# Patient Record
Sex: Male | Born: 1980 | Race: Black or African American | Hispanic: No | Marital: Single | Smoking: Current every day smoker
Health system: Southern US, Community
[De-identification: ages and names within clinical notes are randomized; demographics above are authoritative.]

## PROBLEM LIST (undated history)

## (undated) DIAGNOSIS — K5792 Diverticulitis of intestine, part unspecified, without perforation or abscess without bleeding: Secondary | ICD-10-CM

## (undated) DIAGNOSIS — I10 Essential (primary) hypertension: Secondary | ICD-10-CM

---

## 2003-10-25 ENCOUNTER — Emergency Department (HOSPITAL_COMMUNITY): Admission: EM | Admit: 2003-10-25 | Discharge: 2003-10-26 | Payer: Self-pay | Admitting: Emergency Medicine

## 2004-02-23 ENCOUNTER — Emergency Department (HOSPITAL_COMMUNITY): Admission: EM | Admit: 2004-02-23 | Discharge: 2004-02-24 | Payer: Self-pay | Admitting: Emergency Medicine

## 2005-06-27 ENCOUNTER — Emergency Department (HOSPITAL_COMMUNITY): Admission: EM | Admit: 2005-06-27 | Discharge: 2005-06-27 | Payer: Self-pay | Admitting: Emergency Medicine

## 2006-08-13 ENCOUNTER — Emergency Department (HOSPITAL_COMMUNITY): Admission: EM | Admit: 2006-08-13 | Discharge: 2006-08-13 | Payer: Self-pay | Admitting: Family Medicine

## 2010-12-14 LAB — POCT H PYLORI SCREEN: H. PYLORI SCREEN, POC: NEGATIVE

## 2014-06-29 ENCOUNTER — Emergency Department (HOSPITAL_COMMUNITY)
Admission: EM | Admit: 2014-06-29 | Discharge: 2014-06-29 | Disposition: A | Discharge status: Home / Self Care | Payer: Self-pay | Attending: Emergency Medicine | Admitting: Emergency Medicine

## 2014-06-29 ENCOUNTER — Encounter (HOSPITAL_COMMUNITY): Payer: Self-pay | Admitting: *Deleted

## 2014-06-29 DIAGNOSIS — Z720 Tobacco use: Secondary | ICD-10-CM | POA: Insufficient documentation

## 2014-06-29 DIAGNOSIS — Z79899 Other long term (current) drug therapy: Secondary | ICD-10-CM | POA: Insufficient documentation

## 2014-06-29 DIAGNOSIS — J020 Streptococcal pharyngitis: Secondary | ICD-10-CM | POA: Insufficient documentation

## 2014-06-29 LAB — RAPID STREP SCREEN (MED CTR MEBANE ONLY): Streptococcus, Group A Screen (Direct): POSITIVE — AB

## 2014-06-29 MED ORDER — PENICILLIN V POTASSIUM 500 MG PO TABS: 500.0000 mg | ORAL_TABLET | Freq: Three times a day (TID) | ORAL | Status: DC

## 2014-06-29 MED ORDER — HYDROCODONE-ACETAMINOPHEN 7.5-325 MG/15ML PO SOLN: 10.0000 mL | Freq: Two times a day (BID) | ORAL | Status: DC

## 2014-06-29 MED ORDER — HYDROCODONE-ACETAMINOPHEN 7.5-325 MG/15ML PO SOLN: 10.0000 mL | Freq: Four times a day (QID) | ORAL | Status: DC | PRN

## 2014-06-29 MED ORDER — DEXAMETHASONE SODIUM PHOSPHATE 10 MG/ML IJ SOLN
10.0000 mg | Freq: Once | INTRAMUSCULAR | Status: AC
  Administered 2014-06-29: 10 mg via INTRAMUSCULAR
  Filled 2014-06-29: qty 1

## 2014-06-29 NOTE — ED Notes (Signed)
Declined W/C at D/C and was escorted to lobby by RN. 

## 2014-06-29 NOTE — ED Notes (Signed)
Pt c/o pain when swallowing and cough.  Throat red, white patches on tonsils.  Pt able to tolerate food.

## 2014-06-29 NOTE — ED Provider Notes (Signed)
CSN: [id number]     Arrival date & time 06/29/14  1213 History  This chart was scribed for non-physician practitioner, Evan Peliffany Octavie Westerhold, PA-C, working with Evan OctaveStephen Rancour, MD, by Ronney LionSuzanne Delgado, ED Scribe. This patient was seen in room TR06C/TR06C and the patient's care was started at 2:18 PM.    Chief Complaint  Patient presents with  . Sore Throat   The history is provided by the patient. No language interpreter was used.  HPI Comments: Evan Delgado is a 34 y.o. male who presents to the Emergency Department complaining of a constant, moderate sore throat that began 2 days ago. He endorses associated fatigue and painful swallowing. Patient denies any known sick contact. He has taken ibuprofen for this, with minimal relief. He denies fever, nausea, vomiting, diarrhea, or cough. Patient has NKDA.   History reviewed. No pertinent past medical history. History reviewed. No pertinent past surgical history. No family history on file. History  Substance Use Topics  . Smoking status: Current Every Day Smoker -- 0.50 packs/day    Types: Cigarettes  . Smokeless tobacco: Not on file  . Alcohol Use: No    Review of Systems  Constitutional: Positive for fever.  HENT: Positive for sore throat.   All other systems reviewed and are negative.   Allergies  Review of patient's allergies indicates no known allergies.  Home Medications   Prior to Admission medications   Medication Sig Start Date End Date Taking? Authorizing Provider  HYDROcodone-acetaminophen (HYCET) 7.5-325 mg/15 ml solution Take 10 mLs by mouth 2 (two) times daily. 06/29/14   Evan Peliffany Sherelle Castelli, PA-C  penicillin v potassium (VEETID) 500 MG tablet Take 1 tablet (500 mg total) by mouth 3 (three) times daily. 06/29/14   Evan Bunyard Neva SeatGreene, PA-C   BP 146/100 mmHg  Pulse 73  Temp(Src) 98.3 F (36.8 C) (Oral)  Resp 18  Ht 5\' 9"  (1.753 m)  Wt 250 lb (113.399 kg)  BMI 36.90 kg/m2  SpO2 99% Physical Exam  Constitutional: He is oriented to  person, place, and time. He appears well-developed and well-nourished. No distress.  HENT:  Head: Normocephalic and atraumatic.  Right Ear: Tympanic membrane, external ear and ear canal normal.  Left Ear: Tympanic membrane, external ear and ear canal normal.  Nose: Nose normal. No rhinorrhea. Right sinus exhibits no maxillary sinus tenderness and no frontal sinus tenderness. Left sinus exhibits no maxillary sinus tenderness and no frontal sinus tenderness.  Mouth/Throat: Uvula is midline and mucous membranes are normal. No trismus in the jaw. Normal dentition. No dental abscesses or uvula swelling. Oropharyngeal exudate and posterior oropharyngeal edema present. No posterior oropharyngeal erythema or tonsillar abscesses.  No submental edema, tongue not elevated, no trismus. No impending airway obstruction; Pt able to speak full sentences, swallow intact, no drooling, stridor, or tonsillar/uvula displacement. No palatal petechia  Eyes: Conjunctivae and EOM are normal.  Neck: Trachea normal, normal range of motion and full passive range of motion without pain. Neck supple. No rigidity. No tracheal deviation and normal range of motion present. No Brudzinski's sign noted.  Flexion and extension of neck without pain or difficulty. Able to breath without difficulty in extension.  Cardiovascular: Normal rate and regular rhythm.   Pulmonary/Chest: Effort normal and breath sounds normal. No stridor. No respiratory distress. He has no wheezes.  Abdominal: Soft. There is no tenderness.  No obvious evidence of splenomegaly. Non ttp.   Musculoskeletal: Normal range of motion.  Lymphadenopathy:       Head (right side): No preauricular and  no posterior auricular adenopathy present.       Head (left side): No preauricular and no posterior auricular adenopathy present.    He has cervical adenopathy.  Neurological: He is alert and oriented to person, place, and time.  Skin: Skin is warm and dry. No rash noted. He  is not diaphoretic.  Psychiatric: He has a normal mood and affect. His behavior is normal.  Nursing note and vitals reviewed.   ED Course  Procedures (including critical care time)  DIAGNOSTIC STUDIES: Oxygen Saturation is 99% on RA, normal by my interpretation.    COORDINATION OF CARE: 2:20 PM - Discussed strep test results with patient. Also discussed treatment plan with pt at bedside which includes antibiotic medications, and rest from work for 2 days, and pt agreed to plan.    Labs Review Labs Reviewed  RAPID STREP SCREEN - Abnormal; Notable for the following:    Streptococcus, Group A Screen (Direct) POSITIVE (*)    All other components within normal limits     MDM   Final diagnoses:  Strep pharyngitis   Meds ordered this encounter  Medications  . dexamethasone (DECADRON) injection 10 mg    Sig:   . DISCONTD: penicillin v potassium (VEETID) 500 MG tablet    Sig: Take 1 tablet (500 mg total) by mouth 3 (three) times daily.    Dispense:  30 tablet    Refill:  0    Order Specific Question:  Supervising Provider    Answer:  MILLER, BRIAN [[id number]]  . DISCONTD: HYDROcodone-acetaminophen (HYCET) 7.5-325 mg/15 ml solution    Sig: Take 10 mLs by mouth 4 (four) times daily as needed for moderate pain.    Dispense:  120 mL    Refill:  0    Order Specific Question:  Supervising Provider    Answer:  MILLER, BRIAN [[id number]]  . HYDROcodone-acetaminophen (HYCET) 7.5-325 mg/15 ml solution    Sig: Take 10 mLs by mouth 2 (two) times daily.    Dispense:  60 mL    Refill:  0    Order Specific Question:  Supervising Provider    Answer:  MILLER, BRIAN [[id number]]  . penicillin v potassium (VEETID) 500 MG tablet    Sig: Take 1 tablet (500 mg total) by mouth 3 (three) times daily.    Dispense:  30 tablet    Refill:  0    Order Specific Question:  Supervising Provider    Answer:  Evan Delgado, BRIAN [[id number]]    Medications  dexamethasone (DECADRON) injection 10 mg (not administered)     Filed  Vitals:   06/29/14 1243  BP: 146/100  Pulse: 73  Temp: 98.3 F (36.8 C)  TempSrc: Oral  Resp: 18  Height:  (1.753 m)  Weight: 250 lb (113.399 kg)  SpO2: 99%    33 y.o.Hakiem Urbieta's evaluation in the Emergency Department is complete. It has been determined that no acute conditions requiring further emergency intervention are present at this time. The patient/guardian have been advised of the diagnosis and plan. We have discussed signs and symptoms that warrant return to the ED, such as changes or worsening in symptoms.  Vital signs are stable at discharge. Filed Vitals:   06/29/14 1243  BP: 146/100  Pulse: 73  Temp: 98.3 F (36.8 C)  Resp: 18    Patient/guardian has voiced understanding and agreed to follow-up with the PCP or specialist.  I personally performed the services described in this documentation, which was scribed in my presence. The  recorded information has been reviewed and is accurate.    Evan Pel, PA-C 06/29/14 1425  Evan Octave, MD 06/29/14 1600

## 2014-06-29 NOTE — Discharge Instructions (Signed)

## 2016-09-01 ENCOUNTER — Emergency Department (HOSPITAL_COMMUNITY)
Admission: EM | Admit: 2016-09-01 | Discharge: 2016-09-02 | Disposition: A | Discharge status: Home / Self Care | Payer: Self-pay | Attending: Emergency Medicine | Admitting: Emergency Medicine

## 2016-09-01 ENCOUNTER — Other Ambulatory Visit: Payer: Self-pay

## 2016-09-01 ENCOUNTER — Encounter (HOSPITAL_COMMUNITY): Payer: Self-pay | Admitting: Emergency Medicine

## 2016-09-01 DIAGNOSIS — R197 Diarrhea, unspecified: Secondary | ICD-10-CM | POA: Insufficient documentation

## 2016-09-01 DIAGNOSIS — E860 Dehydration: Secondary | ICD-10-CM | POA: Insufficient documentation

## 2016-09-01 DIAGNOSIS — R112 Nausea with vomiting, unspecified: Secondary | ICD-10-CM | POA: Insufficient documentation

## 2016-09-01 DIAGNOSIS — F17210 Nicotine dependence, cigarettes, uncomplicated: Secondary | ICD-10-CM | POA: Insufficient documentation

## 2016-09-01 HISTORY — DX: Diverticulitis of intestine, part unspecified, without perforation or abscess without bleeding: K57.92

## 2016-09-01 LAB — I-STAT CG4 LACTIC ACID, ED: Lactic Acid, Venous: 2.57 mmol/L (ref 0.5–1.9)

## 2016-09-01 LAB — CBG MONITORING, ED: Glucose-Capillary: 180 mg/dL — ABNORMAL HIGH (ref 65–99)

## 2016-09-01 MED ORDER — SODIUM CHLORIDE 0.9 % IV BOLUS (SEPSIS)
1000.0000 mL | Freq: Once | INTRAVENOUS | Status: AC
  Administered 2016-09-02: 1000 mL via INTRAVENOUS

## 2016-09-01 MED ORDER — SODIUM CHLORIDE 0.9 % IV BOLUS (SEPSIS)
1000.0000 mL | Freq: Once | INTRAVENOUS | Status: AC
  Administered 2016-09-01: 1000 mL via INTRAVENOUS

## 2016-09-01 NOTE — ED Notes (Signed)
The pt is c/o abd pain n v and diarrhea for one week.  He reports that he feels,like he is going to pass out.  Alert but keeps his eyes closed  askikng the male with him to talk for him.  No acute distress vital sign  gooid

## 2016-09-01 NOTE — ED Triage Notes (Signed)
Pt present diaphoretic and presyncopal from home, pt reports abd pain with nausea onset 2 hours ago. Pt appears pale and speaking in short sentences

## 2016-09-01 NOTE — ED Notes (Signed)
Dr Bebe ShaggyWickline given a copy of lactic acid results 2.57

## 2016-09-01 NOTE — ED Notes (Signed)
Head lowered

## 2016-09-01 NOTE — ED Notes (Signed)
No stool on the temp probe

## 2016-09-01 NOTE — ED Notes (Signed)
The pt is hyperventilating and he reports that he is going to pass out.  Cautioned to slow his respirations.. He has not eaten anything today but a banana

## 2016-09-01 NOTE — ED Provider Notes (Signed)
[facility]-EMERGENCY DEPT Provider Note   CSN: [id number] Arrival date & time: 09/01/16  2221 By signing my name below, I, Levon Hedger, attest that this documentation has been prepared under the direction and in the presence of Zadie Rhine, MD . Electronically Signed: Levon Hedger, Scribe. 09/01/2016. 11:25 PM.   History   Chief Complaint Chief Complaint  Patient presents with  . Abdominal Pain   HPI Evan Delgado is a 36 y.o. male with a history of diverticulitis who presents to the Emergency Department complaining of intermttent abdominal pain onset four days ago which has since resolved. He describes this as sharp, shooting pain that is relieved by having bowel movements. Per pt, he is not currently in any pain. He reports associated diaphoresis, nausea, vomiting, non bloody diarrhea, shortness of breath and lightheadedness. Per pt, he has had 2-3 episodes of diarrhea since symptoms began. Pt states he feels as if he is going to pass out, but denies any syncopal episodes. He has never experienced this before. No recent sick contacts; no recent foreign travel. No abdominal SHx. He denies any CP, hematemesis, or hematochezia. Pt has no other acute complaints or associated symptoms at this time.     The history is provided by the patient. No language interpreter was used.  Abdominal Pain   This is a new problem. The current episode started more than 2 days ago. The problem has been resolved. The pain is associated with an unknown factor. The quality of the pain is sharp and shooting. Associated symptoms include diarrhea, nausea and vomiting. Nothing aggravates the symptoms. The symptoms are relieved by bowel activity. His past medical history does not include PUD, gallstones, GERD, ulcerative colitis, Crohn's disease or irritable bowel syndrome.    Past Medical History:  Diagnosis Date  . Diverticulitis     There are no active problems to display for this patient.   History reviewed.  No pertinent surgical history.   Home Medications    Prior to Admission medications   Medication Sig Start Date End Date Taking? Authorizing Provider  HYDROcodone-acetaminophen (HYCET) 7.5-325 mg/15 ml solution Take 10 mLs by mouth 2 (two) times daily. 06/29/14   Marlon Pel, PA-C  penicillin v potassium (VEETID) 500 MG tablet Take 1 tablet (500 mg total) by mouth 3 (three) times daily. 06/29/14   Marlon Pel, PA-C    Family History No family history on file.  Social History Social History  Substance Use Topics  . Smoking status: Current Every Day Smoker    Packs/day: 0.50    Types: Cigarettes  . Smokeless tobacco: Never Used  . Alcohol use No     Allergies   Patient has no known allergies.  Review of Systems Review of Systems  Constitutional: Positive for diaphoresis.  Respiratory: Positive for shortness of breath.   Cardiovascular: Negative for chest pain.  Gastrointestinal: Positive for abdominal pain, diarrhea, nausea and vomiting. Negative for anal bleeding and blood in stool.  Neurological: Negative for syncope.  All other systems reviewed and are negative.   Physical Exam Updated Vital Signs BP (!) 130/94   Pulse (!) 103   Temp (!) 97.1 F (36.2 C) (Axillary)   Resp (!) 26   Ht 5\' 9"  (1.753 m)   Wt 250 lb (113.4 kg)   SpO2 100%   BMI 36.92 kg/m   Physical Exam  Nursing note and vitals reviewed. CONSTITUTIONAL: Ill-appearing and diaphoretic HEAD: Normocephalic/atraumatic EYES: EOMI/PERRL. No icterus  ENMT: Mucous membranes moist NECK: supple no meningeal signs  SPINE/BACK:entire spine nontender CV: S1/S2 noted, no murmurs/rubs/gallops noted LUNGS: Lungs are clear to auscultation bilaterally, no apparent distress ABDOMEN: soft, nontender, no rebound or guarding, bowel sounds noted throughout abdomen GU:no cva tenderness NEURO: Pt is awake/alert/appropriate, moves all extremitiesx4.  No facial droop.   EXTREMITIES: pulses normal/equal, full  ROM SKIN: diaphoretic PSYCH: anxious  ED Treatments / Results  DIAGNOSTIC STUDIES:  Oxygen Saturation is 100% on RA, normal by my interpretation.    COORDINATION OF CARE:  11:11 PM Discussed treatment plan with pt at bedside and pt agreed to plan.   Labs (all labs ordered are listed, but only abnormal results are displayed) Labs Reviewed  COMPREHENSIVE METABOLIC PANEL - Abnormal; Notable for the following:       Result Value   Chloride 99 (*)    CO2 17 (*)    Glucose, Bld 177 (*)    BUN 22 (*)    Creatinine, Ser 1.71 (*)    Total Protein 9.0 (*)    Total Bilirubin 1.5 (*)    GFR calc non Af Amer 50 (*)    GFR calc Af Amer 58 (*)    Anion gap 20 (*)    All other components within normal limits  CBC WITH DIFFERENTIAL/PLATELET - Abnormal; Notable for the following:    WBC 14.6 (*)    RBC 6.10 (*)    Neutro Abs 11.6 (*)    All other components within normal limits  TROPONIN I - Abnormal; Notable for the following:    Troponin I 0.04 (*)    All other components within normal limits  BASIC METABOLIC PANEL - Abnormal; Notable for the following:    CO2 18 (*)    Glucose, Bld 140 (*)    BUN 25 (*)    Creatinine, Ser 1.51 (*)    GFR calc non Af Amer 58 (*)    All other components within normal limits  CBG MONITORING, ED - Abnormal; Notable for the following:    Glucose-Capillary 180 (*)    All other components within normal limits  I-STAT CG4 LACTIC ACID, ED - Abnormal; Notable for the following:    Lactic Acid, Venous 2.57 (*)    All other components within normal limits  LIPASE, BLOOD  I-STAT CG4 LACTIC ACID, ED    EKG  EKG Interpretation  Date/Time:  Friday September 01 2016 22:23:57 EDT Ventricular Rate:  103 PR Interval:  116 QRS Duration: 84 QT Interval:  342 QTC Calculation: 448 R Axis:   47 Text Interpretation:  Sinus tachycardia Otherwise normal ECG Confirmed by Zadie Rhine ([id number]) on 09/01/2016 11:02:59 PM       Radiology No results  found.  Procedures Procedures   Medications Ordered in ED Medications  sodium chloride 0.9 % bolus 1,000 mL (0 mLs Intravenous Stopped 09/02/16 0335)  sodium chloride 0.9 % bolus 1,000 mL (0 mLs Intravenous Stopped 09/02/16 0226)  sodium chloride 0.9 % bolus 1,000 mL (1,000 mLs Intravenous New Bag/Given 09/02/16 0335)  ondansetron (ZOFRAN) injection 4 mg (4 mg Intravenous Given 09/02/16 0115)     Initial Impression / Assessment and Plan / ED Course  I have reviewed the triage vital signs and the nursing notes.  Pertinent labs & imaging results that were available during my care of the patient were reviewed by me and considered in my medical decision making (see chart for details).     11:44 PM Pt very poor historian, but it appears he has had abd pain/diarrhea recently, however pain  now gone He is diaphoretic and hyperventilating Labs ordered 12:15 AM Pt improved Denies ABD pain 12:22 AM Pt with significant dehydration, but improving Will need IV fluids and lab recheck He denies pain No CP reported, elevated troponin likely due to dehydration 1:20 AM Pt vomited large amount, nonbloody However, he now reports feeling improved He does not want to be admitted Plan to rehydrate and recheck labs 4:47 AM Pt improved He is ambulatory Labs improved, lactate cleared and metabolic acidosis improving He is taking PO No focal ABD tenderness He is now urinating He wants to go home  He now reports he thinks it was "bad food" from earlier in the week that triggered diffuse abd cramping and diarrhea His significant other had vomiting after same meal He did have recent antibiotics, but suspicion for cdif is low  Will d/c home Encouraged PO fluids We discussed strict return precautions  Final Clinical Impressions(s) / ED Diagnoses   Final diagnoses:  Nausea vomiting and diarrhea  Dehydration    New Prescriptions New Prescriptions   No medications on file   I personally  performed the services described in this documentation, which was scribed in my presence. The recorded information has been reviewed and is accurate.        Zadie RhineWickline, Gerrald Basu, MD 09/02/16 [phone]

## 2016-09-02 LAB — I-STAT CG4 LACTIC ACID, ED: Lactic Acid, Venous: 1.86 mmol/L (ref 0.5–1.9)

## 2016-09-02 LAB — CBC WITH DIFFERENTIAL/PLATELET
Basophils Absolute: 0 10*3/uL (ref 0.0–0.1)
Basophils Relative: 0 %
Eosinophils Absolute: 0.1 10*3/uL (ref 0.0–0.7)
Eosinophils Relative: 1 %
HEMATOCRIT: 49.1 % (ref 39.0–52.0)
HEMOGLOBIN: 16.9 g/dL (ref 13.0–17.0)
LYMPHS ABS: 2 10*3/uL (ref 0.7–4.0)
Lymphocytes Relative: 14 %
MCH: 27.7 pg (ref 26.0–34.0)
MCHC: 34.4 g/dL (ref 30.0–36.0)
MCV: 80.5 fL (ref 78.0–100.0)
MONO ABS: 0.9 10*3/uL (ref 0.1–1.0)
MONOS PCT: 6 %
NEUTROS PCT: 79 %
Neutro Abs: 11.6 10*3/uL — ABNORMAL HIGH (ref 1.7–7.7)
Platelets: 390 10*3/uL (ref 150–400)
RBC: 6.1 MIL/uL — AB (ref 4.22–5.81)
RDW: 13.8 % (ref 11.5–15.5)
WBC: 14.6 10*3/uL — AB (ref 4.0–10.5)

## 2016-09-02 LAB — BASIC METABOLIC PANEL
Anion gap: 15 (ref 5–15)
BUN: 25 mg/dL — AB (ref 6–20)
CALCIUM: 9.2 mg/dL (ref 8.9–10.3)
CO2: 18 mmol/L — ABNORMAL LOW (ref 22–32)
CREATININE: 1.51 mg/dL — AB (ref 0.61–1.24)
Chloride: 103 mmol/L (ref 101–111)
GFR calc Af Amer: >60 mL/min (ref >60)
GFR calc non Af Amer: 58 mL/min — ABNORMAL LOW (ref >60)
GLUCOSE: 140 mg/dL — AB (ref 65–99)
Potassium: 3.9 mmol/L (ref 3.5–5.1)
Sodium: 136 mmol/L (ref 135–145)

## 2016-09-02 LAB — COMPREHENSIVE METABOLIC PANEL
ALBUMIN: 4.2 g/dL (ref 3.5–5.0)
ALT: 20 U/L (ref 17–63)
ANION GAP: 20 — AB (ref 5–15)
AST: 20 U/L (ref 15–41)
Alkaline Phosphatase: 71 U/L (ref 38–126)
BUN: 22 mg/dL — ABNORMAL HIGH (ref 6–20)
CALCIUM: 10.3 mg/dL (ref 8.9–10.3)
CHLORIDE: 99 mmol/L — AB (ref 101–111)
CO2: 17 mmol/L — AB (ref 22–32)
Creatinine, Ser: 1.71 mg/dL — ABNORMAL HIGH (ref 0.61–1.24)
GFR calc non Af Amer: 50 mL/min — ABNORMAL LOW (ref >60)
GFR, EST AFRICAN AMERICAN: 58 mL/min — AB (ref >60)
GLUCOSE: 177 mg/dL — AB (ref 65–99)
Potassium: 4.4 mmol/L (ref 3.5–5.1)
Sodium: 136 mmol/L (ref 135–145)
TOTAL PROTEIN: 9 g/dL — AB (ref 6.5–8.1)
Total Bilirubin: 1.5 mg/dL — ABNORMAL HIGH (ref 0.3–1.2)

## 2016-09-02 LAB — TROPONIN I: TROPONIN I: 0.04 ng/mL — AB (ref <0.03)

## 2016-09-02 LAB — LIPASE, BLOOD: Lipase: 27 U/L (ref 11–51)

## 2016-09-02 MED ORDER — SODIUM CHLORIDE 0.9 % IV BOLUS (SEPSIS)
1000.0000 mL | Freq: Once | INTRAVENOUS | Status: AC
  Administered 2016-09-02: 1000 mL via INTRAVENOUS

## 2016-09-02 MED ORDER — ONDANSETRON HCL 4 MG/2ML IJ SOLN
4.0000 mg | Freq: Once | INTRAMUSCULAR | Status: AC
  Administered 2016-09-02: 4 mg via INTRAVENOUS
  Filled 2016-09-02: qty 2

## 2016-09-02 NOTE — Discharge Instructions (Signed)

## 2016-09-02 NOTE — ED Notes (Signed)
The pt continues to hyperventilate   Advised to slow down his breathing.  Doing orthostatics when he was asked to sit up and dangle  He threw the vomit bag  In the floor deliberately   The male with him picked it up  He reports that he cannot stand for the upright bp

## 2016-09-02 NOTE — ED Notes (Signed)
Pt asked for water  Given per doctors order.  withing 15 minutes the pt vomited approx 1000cc nss  liquid

## 2016-09-29 ENCOUNTER — Encounter (HOSPITAL_COMMUNITY): Payer: Self-pay

## 2016-09-29 ENCOUNTER — Emergency Department (HOSPITAL_COMMUNITY)
Admission: EM | Admit: 2016-09-29 | Discharge: 2016-09-29 | Disposition: A | Discharge status: Home / Self Care | Payer: Self-pay | Attending: Emergency Medicine | Admitting: Emergency Medicine

## 2016-09-29 DIAGNOSIS — R350 Frequency of micturition: Secondary | ICD-10-CM | POA: Insufficient documentation

## 2016-09-29 DIAGNOSIS — R5383 Other fatigue: Secondary | ICD-10-CM | POA: Insufficient documentation

## 2016-09-29 DIAGNOSIS — R3129 Other microscopic hematuria: Secondary | ICD-10-CM | POA: Insufficient documentation

## 2016-09-29 DIAGNOSIS — F17210 Nicotine dependence, cigarettes, uncomplicated: Secondary | ICD-10-CM | POA: Insufficient documentation

## 2016-09-29 LAB — URINALYSIS, ROUTINE W REFLEX MICROSCOPIC
BACTERIA UA: NONE SEEN
BILIRUBIN URINE: NEGATIVE
GLUCOSE, UA: NEGATIVE mg/dL
Ketones, ur: 20 mg/dL — AB
NITRITE: NEGATIVE
Protein, ur: NEGATIVE mg/dL
Specific Gravity, Urine: 1.016 (ref 1.005–1.030)
pH: 5 (ref 5.0–8.0)

## 2016-09-29 LAB — CBC WITH DIFFERENTIAL/PLATELET
BASOS ABS: 0 10*3/uL (ref 0.0–0.1)
Basophils Relative: 0 %
EOS ABS: 0.1 10*3/uL (ref 0.0–0.7)
EOS PCT: 1 %
HCT: 40.1 % (ref 39.0–52.0)
Hemoglobin: 13.8 g/dL (ref 13.0–17.0)
LYMPHS PCT: 32 %
Lymphs Abs: 3.5 10*3/uL (ref 0.7–4.0)
MCH: 27.4 pg (ref 26.0–34.0)
MCHC: 34.4 g/dL (ref 30.0–36.0)
MCV: 79.7 fL (ref 78.0–100.0)
MONO ABS: 0.8 10*3/uL (ref 0.1–1.0)
Monocytes Relative: 7 %
Neutro Abs: 6.6 10*3/uL (ref 1.7–7.7)
Neutrophils Relative %: 60 %
PLATELETS: 340 10*3/uL (ref 150–400)
RBC: 5.03 MIL/uL (ref 4.22–5.81)
RDW: 14.4 % (ref 11.5–15.5)
WBC: 11 10*3/uL — ABNORMAL HIGH (ref 4.0–10.5)

## 2016-09-29 LAB — BASIC METABOLIC PANEL
ANION GAP: 10 (ref 5–15)
BUN: 9 mg/dL (ref 6–20)
CALCIUM: 9.6 mg/dL (ref 8.9–10.3)
CO2: 24 mmol/L (ref 22–32)
Chloride: 103 mmol/L (ref 101–111)
Creatinine, Ser: 1.1 mg/dL (ref 0.61–1.24)
GFR calc Af Amer: >60 mL/min (ref >60)
Glucose, Bld: 102 mg/dL — ABNORMAL HIGH (ref 65–99)
POTASSIUM: 4.3 mmol/L (ref 3.5–5.1)
SODIUM: 137 mmol/L (ref 135–145)

## 2016-09-29 LAB — CBG MONITORING, ED: Glucose-Capillary: 125 mg/dL — ABNORMAL HIGH (ref 65–99)

## 2016-09-29 NOTE — ED Triage Notes (Signed)
Patient c/o feeling fatigue and having urinary frequency. Patient's BP elevated in triage.

## 2016-09-29 NOTE — ED Provider Notes (Signed)
[facility]-EMERGENCY DEPT Provider Note   CSN: [id number] Arrival date & time: 09/29/16  1729     History   Chief Complaint Chief Complaint  Patient presents with  . Urinary Frequency  . Fatigue  . Hypertension    HPI Evan Delgado is a 36 y.o. male.   Urinary Frequency  This is a new problem. Episode onset: 3 days. The problem occurs daily. The problem has not changed since onset.Pertinent negatives include no chest pain, no abdominal pain, no headaches and no shortness of breath. Nothing aggravates the symptoms. Nothing relieves the symptoms. He has tried nothing for the symptoms.  Hypertension  Pertinent negatives include no chest pain, no abdominal pain, no headaches and no shortness of breath.    Past Medical History:  Diagnosis Date  . Diverticulitis     There are no active problems to display for this patient.   History reviewed. No pertinent surgical history.     Home Medications    Prior to Admission medications   Not on File    Family History No family history on file.  Social History Social History  Substance Use Topics  . Smoking status: Current Every Day Smoker    Packs/day: 0.50    Types: Cigarettes  . Smokeless tobacco: Never Used  . Alcohol use No     Allergies   Patient has no known allergies.   Review of Systems Review of Systems  Constitutional: Positive for fatigue. Negative for chills and fever.  HENT: Negative for ear pain and sore throat.   Eyes: Negative for pain and visual disturbance.  Respiratory: Negative for cough and shortness of breath.   Cardiovascular: Negative for chest pain and palpitations.  Gastrointestinal: Negative for abdominal pain and vomiting.  Genitourinary: Positive for frequency. Negative for decreased urine volume, difficulty urinating, dysuria, flank pain, hematuria and testicular pain.  Musculoskeletal: Negative for arthralgias and back pain.  Skin: Negative for color change and rash.  Neurological:  Negative for seizures, syncope and headaches.  All other systems reviewed and are negative.     Physical Exam Updated Vital Signs BP (!) 159/98 (BP Location: Left Arm)   Pulse 72   Temp 97.9 F (36.6 C) (Oral)   Resp 18   Ht 5\' 9"  (1.753 m)   Wt 113.4 kg (250 lb)   SpO2 100%   BMI 36.92 kg/m   Physical Exam  Constitutional: He is oriented to person, place, and time. He appears well-developed and well-nourished. No distress.  HENT:  Head: Normocephalic and atraumatic.  Nose: Nose normal.  Eyes: Pupils are equal, round, and reactive to light. Conjunctivae and EOM are normal. Right eye exhibits no discharge. Left eye exhibits no discharge. No scleral icterus.  Neck: Normal range of motion. Neck supple.  Cardiovascular: Normal rate and regular rhythm.  Exam reveals no gallop and no friction rub.   No murmur heard. Pulmonary/Chest: Effort normal and breath sounds normal. No stridor. No respiratory distress. He has no rales.  Abdominal: Soft. He exhibits no distension. There is no tenderness.  Musculoskeletal: He exhibits no edema or tenderness.  Neurological: He is alert and oriented to person, place, and time.  Skin: Skin is warm and dry. No rash noted. He is not diaphoretic. No erythema.  Psychiatric: He has a normal mood and affect.  Vitals reviewed.    ED Treatments / Results  Labs (all labs ordered are listed, but only abnormal results are displayed) Labs Reviewed  URINALYSIS, ROUTINE W REFLEX MICROSCOPIC - Abnormal;  Notable for the following:       Result Value   Hgb urine dipstick MODERATE (*)    Ketones, ur 20 (*)    Leukocytes, UA SMALL (*)    Squamous Epithelial / LPF 0-5 (*)    All other components within normal limits  CBC WITH DIFFERENTIAL/PLATELET - Abnormal; Notable for the following:    WBC 11.0 (*)    All other components within normal limits  BASIC METABOLIC PANEL - Abnormal; Notable for the following:    Glucose, Bld 102 (*)    All other components  within normal limits  CBG MONITORING, ED - Abnormal; Notable for the following:    Glucose-Capillary 125 (*)    All other components within normal limits    EKG  EKG Interpretation None       Radiology No results found.  Procedures Procedures (including critical care time)  Medications Ordered in ED Medications - No data to display   Initial Impression / Assessment and Plan / ED Course  I have reviewed the triage vital signs and the nursing notes.  Pertinent labs & imaging results that were available during my care of the patient were reviewed by me and considered in my medical decision making (see chart for details).    1. Urinary frequency UA with hematuria. Not concerning for urinary tract infection. No flank pain or abdominal pain that would be consistent with renal colic from nephrolithiasis. No evidence of diabetes. No renal insufficiency. No electrolyte derangements. No anemia.   2. Hypertension Asymptomatic. Improving without intervention. On review of records patient has not had hypertension in the past. Unsure of etiology at this time. No evidence of end organ damage. We will not initiate antihypertensive medication. I did recommend patient establish care with a primary care provider for reevaluation of the hypertension and his hematuria.  Patient was provided with resources for financial assistance.  The patient is safe for discharge with strict return precautions.  Final Clinical Impressions(s) / ED Diagnoses   Final diagnoses:  Other microscopic hematuria  Urinary frequency  Fatigue, unspecified type   Disposition: Discharge  Condition: Good  I have discussed the results, Dx and Tx plan with the patient who expressed understanding and agree(s) with the plan. Discharge instructions discussed at great length. The patient was given strict return precautions who verbalized understanding of the instructions. No further questions at time of discharge.     New Prescriptions   No medications on file    Follow Up: Primary care provider         Nira Connardama, Pedro Eduardo, MD 09/29/16 2321

## 2016-10-01 ENCOUNTER — Emergency Department (HOSPITAL_COMMUNITY)
Admission: EM | Admit: 2016-10-01 | Discharge: 2016-10-01 | Disposition: A | Discharge status: Home / Self Care | Payer: Self-pay | Attending: Emergency Medicine | Admitting: Emergency Medicine

## 2016-10-01 ENCOUNTER — Encounter (HOSPITAL_COMMUNITY): Payer: Self-pay | Admitting: Nurse Practitioner

## 2016-10-01 DIAGNOSIS — F17210 Nicotine dependence, cigarettes, uncomplicated: Secondary | ICD-10-CM | POA: Insufficient documentation

## 2016-10-01 DIAGNOSIS — I10 Essential (primary) hypertension: Secondary | ICD-10-CM | POA: Insufficient documentation

## 2016-10-01 MED ORDER — ONDANSETRON 4 MG PO TBDP
4.0000 mg | ORAL_TABLET | Freq: Once | ORAL | Status: AC
  Administered 2016-10-01: 4 mg via ORAL
  Filled 2016-10-01: qty 1

## 2016-10-01 NOTE — Discharge Instructions (Signed)
Please follow up with a primary care provider for further evaluation of your health and have your blood pressure recheck.

## 2016-10-01 NOTE — ED Notes (Signed)
Patient verbalized understanding of discharge instructions and denies any further needs or questions at this time. VS stable. Patient ambulatory with steady gait. RN escorted to ED entrance in wheelchair.   

## 2016-10-01 NOTE — ED Provider Notes (Signed)
[facility]-EMERGENCY DEPT Provider Note   CSN: [id number] Arrival date & time: 10/01/16  1539     History   Chief Complaint Chief Complaint  Patient presents with  . Hypertension    HPI Evan Delgado is a 36 y.o. male.  HPI   36 year old male without hx of HTN presenting for evaluation of high blood pressure. Patient was seen 2 days ago in the ER with complaints of urinary frequency and elevated blood pressure. He did have blood in his urine but no evidence of a tract infection or signs of kidney stone. His electrolytes panel are reassuring.No hyperglycemia He was mildly hypertensive during that visit however he is asymptomatic and no specific treatment was given. He was recommended to follow-up with a Primary Care Provider for further care. [facility] and it was 140/90 and was concern, decided to come back to the ER for further evaluation. He denies any significant fever, chills, headache, dizziness, chest pain, shortness of breath, abdominal pain, back pain, focal numbness or weakness. Does smoke 2 cigarettes a day. Denies any strong family history of premature cardiac disease. No history of diabetes. He has not scheduled an appointment for PCP yet. He has been eating and drinking fine.  Past Medical History:  Diagnosis Date  . Diverticulitis     There are no active problems to display for this patient.   History reviewed. No pertinent surgical history.     Home Medications    Prior to Admission medications   Not on File    Family History History reviewed. No pertinent family history.  Social History Social History  Substance Use Topics  . Smoking status: Current Every Day Smoker    Packs/day: 0.50    Types: Cigarettes  . Smokeless tobacco: Never Used  . Alcohol use No     Allergies   Patient has no known allergies.   Review of Systems Review of Systems  All other systems reviewed and are negative.    Physical  Exam Updated Vital Signs BP (!) 154/102   Pulse 96   Temp 98 F (36.7 C) (Oral)   Resp 16   SpO2 100%   Physical Exam  Constitutional: He is oriented to person, place, and time. He appears well-developed and well-nourished. No distress.  HENT:  Head: Atraumatic.  Eyes: Conjunctivae are normal.  Neck: Neck supple.  Cardiovascular: Normal rate and regular rhythm.   Pulmonary/Chest: Effort normal and breath sounds normal.  Abdominal: Soft. Bowel sounds are normal. He exhibits no distension. There is no tenderness.  Neurological: He is alert and oriented to person, place, and time.  Neurologic exam:  Speech clear, pupils equal round reactive to light, extraocular movements intact  Normal peripheral visual fields Cranial nerves III through XII normal including no facial droop Follows commands, moves all extremities x4, normal strength to bilateral upper and lower extremities at all major muscle groups including grip Sensation normal to light touch and pinprick Coordination intact, no limb ataxia, finger-nose-finger normal Rapid alternating movements normal No pronator drift Gait normal   Skin: No rash noted.  Psychiatric: He has a normal mood and affect.  Nursing note and vitals reviewed.    ED Treatments / Results  Labs (all labs ordered are listed, but only abnormal results are displayed) Labs Reviewed - No data to display  EKG  EKG Interpretation None       Radiology No results found.  Procedures Procedures (including critical care time)  Medications Ordered  in ED Medications - No data to display   Initial Impression / Assessment and Plan / ED Course  I have reviewed the triage vital signs and the nursing notes.  Pertinent labs & imaging results that were available during my care of the patient were reviewed by me and considered in my medical decision making (see chart for details).     BP (!) 153/99   Pulse 75   Temp 98 F (36.7 C) (Oral)   Resp 16    SpO2 100%    Final Clinical Impressions(s) / ED Diagnoses   Final diagnoses:  Asymptomatic hypertension    New Prescriptions New Prescriptions   No medications on file   4:59 PM Patient here complaining of feeling lightheadedness and not feeling well which she attributed to his high blood pressure that was incidentally found 2 days ago when he was complaining of some symptoms. His blood pressure is 154/102. He has no focal neuro deficit on exam. He has no chest pain or any sign to suggest hypertensive emergency. He is well-appearing. I do not think patient would benefit from blood pressure medication at this time. He will need to be seen by PCP for definitive diagnosis of hypertension. He does not have orthostatic vital sign. Encouraged patient to quit smoking. Return precaution discussed. Patient is stable for discharge. He does need to follow-up with a urologist for further evaluation of his hematuria.   Fayrene Helperran, Ellyce Lafevers, PA-C 10/01/16 1700    Gerhard MunchLockwood, Robert, MD 10/01/16 [phone]

## 2016-10-01 NOTE — ED Triage Notes (Signed)
Pt presents with c/o elevated blood pressure.  He was seen at [facility] two days ago and told that his blood pressure was high and he should schedule follow up with PCP for further management.  He checked his blood pressure yesterday and today at home with elevated readings of 140's/90s. He reports feeling lightheaded over the past several days and is concerned that his blood pressure is too high. He denies past history of hypertension prior to this week. He has not scheduled a primary care appointment yet.

## 2016-11-02 DIAGNOSIS — I10 Essential (primary) hypertension: Secondary | ICD-10-CM | POA: Insufficient documentation

## 2016-11-17 ENCOUNTER — Encounter (HOSPITAL_COMMUNITY): Payer: Self-pay | Admitting: Emergency Medicine

## 2016-11-17 ENCOUNTER — Emergency Department (HOSPITAL_COMMUNITY): Payer: Self-pay

## 2016-11-17 ENCOUNTER — Emergency Department (HOSPITAL_COMMUNITY)
Admission: EM | Admit: 2016-11-17 | Discharge: 2016-11-17 | Disposition: A | Discharge status: Home / Self Care | Payer: Self-pay | Attending: Emergency Medicine | Admitting: Emergency Medicine

## 2016-11-17 DIAGNOSIS — Z5321 Procedure and treatment not carried out due to patient leaving prior to being seen by health care provider: Secondary | ICD-10-CM | POA: Insufficient documentation

## 2016-11-17 DIAGNOSIS — R079 Chest pain, unspecified: Secondary | ICD-10-CM | POA: Insufficient documentation

## 2016-11-17 LAB — BASIC METABOLIC PANEL
ANION GAP: 9 (ref 5–15)
BUN: 7 mg/dL (ref 6–20)
CALCIUM: 9.5 mg/dL (ref 8.9–10.3)
CO2: 25 mmol/L (ref 22–32)
Chloride: 103 mmol/L (ref 101–111)
Creatinine, Ser: 1.17 mg/dL (ref 0.61–1.24)
GFR calc Af Amer: >60 mL/min (ref >60)
GLUCOSE: 104 mg/dL — AB (ref 65–99)
POTASSIUM: 4.2 mmol/L (ref 3.5–5.1)
SODIUM: 137 mmol/L (ref 135–145)

## 2016-11-17 LAB — CBC
HEMATOCRIT: 37.7 % — AB (ref 39.0–52.0)
HEMOGLOBIN: 12.4 g/dL — AB (ref 13.0–17.0)
MCH: 26.2 pg (ref 26.0–34.0)
MCHC: 32.9 g/dL (ref 30.0–36.0)
MCV: 79.5 fL (ref 78.0–100.0)
Platelets: 387 10*3/uL (ref 150–400)
RBC: 4.74 MIL/uL (ref 4.22–5.81)
RDW: 14.4 % (ref 11.5–15.5)
WBC: 11.5 10*3/uL — AB (ref 4.0–10.5)

## 2016-11-17 LAB — I-STAT TROPONIN, ED: TROPONIN I, POC: 0 ng/mL (ref 0.00–0.08)

## 2016-11-17 NOTE — ED Notes (Signed)
No call during vital assessment.  

## 2016-11-17 NOTE — ED Notes (Signed)
Pt,left due wait time .

## 2016-11-17 NOTE — ED Triage Notes (Signed)
Patient arrives with complaint left chest pain. Onset today while at rest. Also endorses dizziness, onset 2-3 weeks. HTN history, states his medications were recently changed.

## 2016-11-21 ENCOUNTER — Other Ambulatory Visit: Payer: Self-pay

## 2016-11-21 ENCOUNTER — Emergency Department (HOSPITAL_COMMUNITY)
Admission: EM | Admit: 2016-11-21 | Discharge: 2016-11-21 | Discharge status: Against Medical Advice | Payer: Self-pay | Attending: Emergency Medicine | Admitting: Emergency Medicine

## 2016-11-21 ENCOUNTER — Encounter (HOSPITAL_COMMUNITY): Payer: Self-pay | Admitting: *Deleted

## 2016-11-21 ENCOUNTER — Emergency Department (HOSPITAL_COMMUNITY): Payer: Self-pay

## 2016-11-21 DIAGNOSIS — Z5321 Procedure and treatment not carried out due to patient leaving prior to being seen by health care provider: Secondary | ICD-10-CM | POA: Insufficient documentation

## 2016-11-21 HISTORY — DX: Essential (primary) hypertension: I10

## 2016-11-21 LAB — BASIC METABOLIC PANEL
ANION GAP: 7 (ref 5–15)
BUN: 12 mg/dL (ref 6–20)
CHLORIDE: 104 mmol/L (ref 101–111)
CO2: 27 mmol/L (ref 22–32)
Calcium: 9.5 mg/dL (ref 8.9–10.3)
Creatinine, Ser: 1.08 mg/dL (ref 0.61–1.24)
GFR calc non Af Amer: >60 mL/min (ref >60)
Glucose, Bld: 99 mg/dL (ref 65–99)
POTASSIUM: 4 mmol/L (ref 3.5–5.1)
Sodium: 138 mmol/L (ref 135–145)

## 2016-11-21 LAB — CBC
HEMATOCRIT: 37.8 % — AB (ref 39.0–52.0)
HEMOGLOBIN: 12.5 g/dL — AB (ref 13.0–17.0)
MCH: 26.4 pg (ref 26.0–34.0)
MCHC: 33.1 g/dL (ref 30.0–36.0)
MCV: 79.9 fL (ref 78.0–100.0)
Platelets: 422 10*3/uL — ABNORMAL HIGH (ref 150–400)
RBC: 4.73 MIL/uL (ref 4.22–5.81)
RDW: 14.7 % (ref 11.5–15.5)
WBC: 11.1 10*3/uL — ABNORMAL HIGH (ref 4.0–10.5)

## 2016-11-21 LAB — POCT I-STAT TROPONIN I: Troponin i, poc: 0 ng/mL (ref 0.00–0.08)

## 2016-11-21 NOTE — ED Triage Notes (Signed)
Pt reports L cp with dizziness since last night at 1800. Pt also reports RLQ pain which also started last night after the onset of cp.  Pt denies SOB or diaphoresis.  States he was seen at [facility] ED for same 3 days ago but did not stay to be seen d/t long wait time.

## 2016-11-21 NOTE — ED Notes (Signed)
No answer from the lobby

## 2016-11-21 NOTE — ED Notes (Signed)
Pt continues not to answer.  It is assumed he left.

## 2016-11-29 DIAGNOSIS — K5780 Diverticulitis of intestine, part unspecified, with perforation and abscess without bleeding: Secondary | ICD-10-CM | POA: Insufficient documentation

## 2016-11-29 DIAGNOSIS — K5720 Diverticulitis of large intestine with perforation and abscess without bleeding: Secondary | ICD-10-CM | POA: Insufficient documentation

## 2016-11-29 DIAGNOSIS — R079 Chest pain, unspecified: Secondary | ICD-10-CM | POA: Insufficient documentation

## 2016-11-29 DIAGNOSIS — Z720 Tobacco use: Secondary | ICD-10-CM | POA: Insufficient documentation

## 2016-11-30 DIAGNOSIS — K5790 Diverticulosis of intestine, part unspecified, without perforation or abscess without bleeding: Secondary | ICD-10-CM | POA: Insufficient documentation

## 2016-11-30 DIAGNOSIS — F419 Anxiety disorder, unspecified: Secondary | ICD-10-CM | POA: Insufficient documentation

## 2016-11-30 DIAGNOSIS — R7303 Prediabetes: Secondary | ICD-10-CM | POA: Insufficient documentation

## 2016-12-04 ENCOUNTER — Encounter: Payer: Self-pay | Admitting: Physician Assistant

## 2016-12-13 ENCOUNTER — Encounter: Payer: Self-pay | Admitting: Physician Assistant

## 2016-12-13 ENCOUNTER — Ambulatory Visit (INDEPENDENT_AMBULATORY_CARE_PROVIDER_SITE_OTHER): Payer: Self-pay | Admitting: Physician Assistant

## 2016-12-13 VITALS — BP 140/90 | HR 84 | Ht 68.5 in | Wt 242.0 lb

## 2016-12-13 DIAGNOSIS — K5720 Diverticulitis of large intestine with perforation and abscess without bleeding: Secondary | ICD-10-CM

## 2016-12-13 NOTE — Progress Notes (Signed)
Agree with assessment and plan as outlined.  Patient has had complicated diverticulitis with a microperforation and associated abscess. Sounds like drainage was not possible at [facility] but he's improved with conservative therapy on antibiotics. I have reviewed his CT scans, they continue to show improvement on antibiotics. Given his microperforation and that he has had recurrent diverticulitis in this area, he needs surgical evaluation to discuss possible colonic resection to prevent future recurrence. He will warrant a colonoscopy at some point in time, at least 6 weeks out from healing from this episode. Agree that he should keep his follow up with surgery scheduled for tomorrow.

## 2016-12-13 NOTE — Progress Notes (Signed)
Chief Complaint: Diverticulitis  HPI:  [name] is a 36 year old male with a past medical history as listed below who presents to clinic today as a new patient for a complaint of diverticulitis.     Per review of chart patient was initially seen at the [facility] ER 11/28/16 for complaint of diverticulitis. He was discharged home on antibiotics. Subsequently it appears patient developed increasing pain and was admitted to the hospital 12/04/16-12/05/16. Patient had a CT the abdomen and pelvis with contrast on 12/04/16 which showed perforated sigmoid diverticulitis was small pericolonic abscess and fistulous track within the sigmoid mesentery. There had been no significant change from his prior study. His white count remained stable at 8.2. It appears patient had an unsuccessful drain placement 12/05/16. Patient had repeat CT the abdomen and pelvis 12/11/16 which showed acute sigmoid diverticulitis with tiny pocket of air and inflammatory changes seen within the region of the adjacent left anterior psoas muscle reflecting microperforation. Trace adjacent free fluid. No well-formed fluid collection.    Patient's last labs were completed 12/11/16 and showed potassium of 3.6 and his CBC with hemoglobin low at 11.5.    Today, the patient presents clinic and explains that he is still on his Cefdinir twice a day and Metronidazole 3 times a day. He has 4 days left of these antibiotics. Overall, the patient is feeling much improved. He tells me he came here of his own accord, just for a second opinion to make sure that everything was being treated appropriately. Patient describes that when his symptoms initially started they were "never that bad". He tells me he had a left lower quadrant pain and some nausea, but never ran a fever or had other symptoms. The patient was somewhat confused regarding why they "couldn't drain the fluid". Apparently, this was not discussed very well with him after the procedures as he was  waking up from anesthesia. Patient tells me that he went back to the ER of his own accord on the 15th to have a repeat CT to ensure that things were getting better. Overall, the patient feels better, he only has a minimal amount of left lower quadrant pain. He has no fever or chills and is tolerating the antibiotics. His bowel movements are still sort of "mushy", but he denies any blood in his stool. Patient tells me he does have follow with the surgical team at [facility] tomorrow.   Patient denies continued fever, chills, weight loss, anorexia, nausea, vomiting, heartburn, reflux or family history of colon cancer.  Past Medical History:  Diagnosis Date  . Diverticulitis   . Hypertension     No past surgical history on file.  Current Outpatient Prescriptions  Medication Sig Dispense Refill  . carvedilol (COREG) 3.125 MG tablet Take 3.125 mg by mouth 2 (two) times daily with a meal.     . clonazePAM (KLONOPIN) 0.5 MG tablet Take 0.25-0.5 mg by mouth 2 (two) times daily as needed for anxiety.     No current facility-administered medications for this visit.     Allergies as of 12/13/2016  . (No Known Allergies)    No family history on file.  Social History   Social History  . Marital status: Single    Spouse name: N/A  . Number of children: N/A  . Years of education: N/A   Occupational History  . Not on file.   Social History Main Topics  . Smoking status: Current Every Day Smoker    Packs/day: 0.50  Types: Cigarettes  . Smokeless tobacco: Never Used  . Alcohol use No  . Drug use: No  . Sexual activity: Not on file   Other Topics Concern  . Not on file   Social History Narrative  . No narrative on file    Review of Systems:    Constitutional: No weight loss, fever or chills Skin: No rash  Cardiovascular: No chest pain Respiratory: No SOB  Gastrointestinal: See HPI and otherwise negative Genitourinary: No dysuria  Neurological: No headache, dizziness or  syncope Musculoskeletal: No new muscle or joint pain Hematologic: No bleeding Psychiatric: No history of depression or anxiety   Physical Exam:  Vital signs: BP 140/90   Pulse 84   Ht 5' 8.5" (1.74 m) Comment: height measured without shoes  Wt 242 lb (109.8 kg)   SpO2 99%   BMI 36.26 kg/m    Constitutional:   AA male appears to be in NAD, Well developed, Well nourished, alert and cooperative Head:  Normocephalic and atraumatic. Eyes:   PEERL, EOMI. No icterus. Conjunctiva pink. Ears:  Normal auditory acuity. Neck:  Supple Throat: Oral cavity and pharynx without inflammation, swelling or lesion.  Respiratory: Respirations even and unlabored. Lungs clear to auscultation bilaterally.   No wheezes, crackles, or rhonchi.  Cardiovascular: Normal S1, S2. No MRG. Regular rate and rhythm. No peripheral edema, cyanosis or pallor.  Gastrointestinal:  Soft, nondistended, mild LLQ ttp to deep palpation, No rebound or guarding. Normal bowel sounds. No appreciable masses or hepatomegaly. Rectal:  Not performed.  Msk:  Symmetrical without gross deformities. Without edema, no deformity or joint abnormality.  Neurologic:  Alert and  oriented x4;  grossly normal neurologically.  Skin:   Dry and intact without significant lesions or rashes. Psychiatric:Demonstrates good judgement and reason without abnormal affect or behaviors.  See HPI for most recent labs.  Imaging: Result Date: 12/04/2016 CT ABDOMEN AND PELVIS WITH CONTRAST INDICATION: Abd pain, fever, abscess suspected COMPARISON: CT 12/01/2016. TECHNIQUE: CT imaging of the abdomen and pelvis was performed after the intravenous administration of 100 mL of Isovue-370 iodinated contrast. Dose reduction was utilized (automated exposure control, mA or kV adjustment based on patient size, or iterative image reconstruction). Coronal and sagittal reformatted images were generated and reviewed. FINDINGS: - Lower Thorax: Unremarkable. - Liver: Normal contour  and attenuation. No focal lesions. Patent hepatic veins and portal veins. - Biliary Tree and Gallbladder: No biliary ductal dilatation. The gallbladder is unremarkable. - Pancreas: Normal. - Spleen: Normal. - Adrenal Glands: Normal. - Kidneys and Bladder: No hydronephrosis. No focal renal lesions. The bladder is unremarkable. - Gastrointestinal Tract: Again seen is focal inflammatory change to suggest diverticulitis of the sigmoid colon. A pericolonic fluid collection abutting the left psoas muscle now measures 4.1 x 2.8 cm, previously 3.8 x 2.2 cm. No new fluid collection identified. Small fistulous tract within the sigmoid mesentery is similar to prior; there is tethering to adjacent small bowel loops, but no clear enterocolonic fistula. Normal appendix. - Lymph Nodes: No retroperitoneal, mesenteric or pelvic lymphadenopathy. - Vasculature: Normal caliber abdominal aorta. Patent proximal aortic branch vessels. - Reproductive: No masses. - Musculoskeletal: No aggressive appearing osseous lesions. - Miscellaneous: N/A   Impression: Perforated sigmoid diverticulitis with small pericolonic abscess and fistulous track within the sigmoid mesentery. No significant change from prior. Electronically Signed by: Eddie Candle         Acute Interface, Incoming Rad Results - 12/11/2016 10:56 PM EDT INDICATION: Left lower quadrant pain.. COMPARISON:  CT December 04, 2016.   TECHNIQUE:  CT ABDOMEN PELVIS W IV CONTRAST - 100 mL Isovue 370 were administered intravenously  Radiation dose reduction was utilized (automated exposure control, mA or kV adjustment based on patient size, or iterative image reconstruction).  FINDINGS:   VISUALIZED LOWER THORAX: No acute abnormalities.  SOLID VISCERA: Liver: Normal. Gallbladder: No radiodense gallstones. Pancreas: Normal. Adrenal glands: Normal. Spleen: Normal. Kidneys: Normal.  GI: No bowel obstruction. Moderate retained stool in the colon. Colonic  diverticulosis. There is wall thickening and pericolic stranding within the colon reflecting acute diverticulitis. There are tiny pockets of air seen within the region of the left anterior psoas muscle reflect microperforation. Trace adjacent  free fluid. No well-formed fluid collections. No evidence of acute appendicitis.   PERITONEAL CAVITY/RETROPERITONEUM:  No pneumoperitoneum. No lymphadenopathy. Mild aortic atherosclerotic disease.  PELVIS: No acute abnormalities.  MUSCULOSKELETAL: No acute or destructive osseous processes.  MISC: N/A.   IMPRESSION:  Acute sigmoid diverticulitis with tiny pocket of air and inflammatory change seen within the region of the adjacent left anterior psoas muscle reflecting microperforation. Trace adjacent free fluid. No well-formed fluid collection.  Electronically Signed by: Abner GreenspanEdward Lopez   Assessment: 1. Diverticulitis with abscess: See above imaging showing left anterior psoas muscle abscess which has decreased in size over the past 10 days, patient currently on Cefdinir and Flagyl, minimal left lower quadrant pain, no other symptoms  Plan: 1. Had a long discussion with the patient today regarding his prior workup including multiple CTs and attempted abscess drainage. It appears the abscess has gotten smaller with antibiotics over the past 10 days. Explained that he has been treated correctly and gave him further information regarding why the fluid could not be pulled from his abscess and/or drain placed. Used images in the room to explain. 2. Would recommend the patient continue to follow with [facility] as they have seen him for this entire course and are treating him appropriately. I did explain that they will likely recommend a colonoscopy at some point in the future possibly 4-6 weeks from finishing antibiotics to ensure that there are no underlying processes which would make him more prone to having inflammation in his colon in the future.  Did explain that if he wishes be could have a colonoscopy here. 3. Patient to finish his antibiotics as prescribed. 4. Patient will call our clinic if he wishes to follow with us in the near future. He was assigned to Dr. Adela LankArmbruster today.  Hyacinth MeekerJennifer Anmol Fleck, PA-C [facility] 12/13/2016, 9:25 AM  Cc: No ref. provider found

## 2016-12-25 DIAGNOSIS — K5792 Diverticulitis of intestine, part unspecified, without perforation or abscess without bleeding: Secondary | ICD-10-CM | POA: Insufficient documentation

## 2017-01-10 ENCOUNTER — Emergency Department (HOSPITAL_COMMUNITY)
Admission: EM | Admit: 2017-01-10 | Discharge: 2017-01-10 | Disposition: A | Discharge status: Home / Self Care | Payer: Self-pay | Attending: Emergency Medicine | Admitting: Emergency Medicine

## 2017-01-10 ENCOUNTER — Other Ambulatory Visit: Payer: Self-pay

## 2017-01-10 ENCOUNTER — Encounter (HOSPITAL_COMMUNITY): Payer: Self-pay | Admitting: Emergency Medicine

## 2017-01-10 DIAGNOSIS — I10 Essential (primary) hypertension: Secondary | ICD-10-CM | POA: Insufficient documentation

## 2017-01-10 DIAGNOSIS — Y9389 Activity, other specified: Secondary | ICD-10-CM | POA: Insufficient documentation

## 2017-01-10 DIAGNOSIS — Y929 Unspecified place or not applicable: Secondary | ICD-10-CM | POA: Insufficient documentation

## 2017-01-10 DIAGNOSIS — T148XXA Other injury of unspecified body region, initial encounter: Secondary | ICD-10-CM

## 2017-01-10 DIAGNOSIS — Y33XXXA Other specified events, undetermined intent, initial encounter: Secondary | ICD-10-CM | POA: Insufficient documentation

## 2017-01-10 DIAGNOSIS — F17210 Nicotine dependence, cigarettes, uncomplicated: Secondary | ICD-10-CM | POA: Insufficient documentation

## 2017-01-10 DIAGNOSIS — Z79899 Other long term (current) drug therapy: Secondary | ICD-10-CM | POA: Insufficient documentation

## 2017-01-10 DIAGNOSIS — S50851A Superficial foreign body of right forearm, initial encounter: Secondary | ICD-10-CM | POA: Insufficient documentation

## 2017-01-10 DIAGNOSIS — Y999 Unspecified external cause status: Secondary | ICD-10-CM | POA: Insufficient documentation

## 2017-01-10 NOTE — Discharge Instructions (Signed)
You may take Tylenol or Motrin for discomfort.  Follow-up with your primary care doctor as needed.

## 2017-01-10 NOTE — ED Provider Notes (Signed)
[facility] EMERGENCY DEPARTMENT Provider Note   CSN: [id number] Arrival date & time: 01/10/17  0202     History   Chief Complaint Chief Complaint  Patient presents with  . Foreign Body in Skin    HPI Evan Delgado is a 36 y.o. male.  36 year old male presents to the emergency department for removal of his peripheral IV.  He was seen at [facility] earlier today and reports being discharged around midnight.  He states that he went home and was eating when he noted soreness to his right AC joint.  He looked down and noticed that his IV was still in place from his hospitalization earlier this evening.  He was told to come in to have it removed.  IV was removed in triage by nursing.  Patient denies any complications with this removal including increased pain or bleeding.  He has no additional complaints at this time.   The history is provided by the patient. No language interpreter was used.    Past Medical History:  Diagnosis Date  . Diverticulitis   . Diverticulitis   . Hypertension     There are no active problems to display for this patient.   History reviewed. No pertinent surgical history.     Home Medications    Prior to Admission medications   Medication Sig Start Date End Date Taking? Authorizing Provider  cefdinir (OMNICEF) 300 MG capsule Take 300 mg by mouth 2 (two) times daily.    [provider]  clonazePAM (KLONOPIN) 0.5 MG tablet Take 0.25-0.5 mg by mouth 2 (two) times daily as needed for anxiety.    [provider]  metroNIDAZOLE (FLAGYL) 500 MG tablet Take 500 mg by mouth 3 (three) times daily.    [provider]    Family History Family History  Problem Relation Age of Onset  . Diabetes Father   . Diabetes Brother     Social History Social History   Tobacco Use  . Smoking status: Current Every Day Smoker    Packs/day: 0.50    Types: Cigarettes  . Smokeless tobacco: Never Used  Substance  Use Topics  . Alcohol use: No  . Drug use: No     Allergies   Patient has no known allergies.   Review of Systems Review of Systems Ten systems reviewed and are negative for acute change, except as noted in the HPI.    Physical Exam Updated Vital Signs BP (!) 147/97 (BP Location: Right Arm)   Pulse 88   Temp 98.6 F (37 C) (Oral)   Resp 15   Ht 5\' 8"  (1.727 m)   Wt 109.8 kg (242 lb)   SpO2 100%   BMI 36.80 kg/m   Physical Exam  Constitutional: He is oriented to person, place, and time. He appears well-developed and well-nourished. No distress.  Nontoxic appearing and in no acute distress  HENT:  Head: Normocephalic and atraumatic.  Eyes: Conjunctivae and EOM are normal. No scleral icterus.  Neck: Normal range of motion.  Cardiovascular: Normal rate, regular rhythm and intact distal pulses.  2+ distal radial pulse in the right upper extremity.  Pulmonary/Chest: Effort normal. No respiratory distress.  Respirations even and unlabored  Musculoskeletal: Normal range of motion.  Patient with bandage over the right AC, but no surrounding induration.  No red linear streaking.  Neurological: He is alert and oriented to person, place, and time. He exhibits normal muscle tone. Coordination normal.  Patient ambulatory with steady gait,  moving all extremities.  Skin: Skin is warm and dry. No rash noted. He is not diaphoretic. No erythema. No pallor.  Psychiatric: He has a normal mood and affect. His behavior is normal.  Nursing note and vitals reviewed.    ED Treatments / Results  Labs (all labs ordered are listed, but only abnormal results are displayed) Labs Reviewed - No data to display  EKG  EKG Interpretation None       Radiology No results found.  Procedures .Foreign Body Removal Date/Time: 01/10/2017 2:42 AM Performed by: Antony MaduraHumes, Tayshaun Kroh, PA-C Authorized by: Antony MaduraHumes, Bernardine Langworthy, PA-C  Consent: The procedure was performed in an emergent situation. Verbal consent  obtained. Written consent not obtained. Risks and benefits: risks, benefits and alternatives were discussed Consent given by: patient Required items: required blood products, implants, devices, and special equipment available Patient identity confirmed: verbally with patient Time out: Immediately prior to procedure a "time out" was called to verify the correct patient, procedure, equipment, support staff and site/side marked as required. Body area: skin General location: upper extremity Location details: right upper arm Patient restrained: no Patient cooperative: yes Complexity: simple 1 objects recovered. Objects recovered: peripheral IV Post-procedure assessment: foreign body removed Patient tolerance: Patient tolerated the procedure well with no immediate complications   (including critical care time)  Medications Ordered in ED Medications - No data to display   Initial Impression / Assessment and Plan / ED Course  I have reviewed the triage vital signs and the nursing notes.  Pertinent labs & imaging results that were available during my care of the patient were reviewed by me and considered in my medical decision making (see chart for details).     36 year old male presents for removal of his peripheral IV which was placed during [facility].  IV removed in triage without complications.  Patient neurovascularly intact.  He has no additional complaints.  Discharged in stable condition.   Final Clinical Impressions(s) / ED Diagnoses   Final diagnoses:  Foreign body in skin    ED Discharge Orders    None       Antony MaduraHumes, Captola Teschner, PA-C 01/10/17 0244    Glynn Octaveancour, Stephen, MD 01/10/17 [phone]

## 2017-01-10 NOTE — ED Triage Notes (Signed)
Pt st's he was at [facility] earlier tonight and they left his IV in his right ac.  Pt needs IV removed

## 2018-10-17 IMAGING — CR DG CHEST 2V
2 series · 2 of 2 positions shown · non-contrast
Comparison: None.

CLINICAL DATA: Chest pain, dizziness, nausea and vomiting for 1
day.

EXAM:
CHEST  2 VIEW

[chest lat]
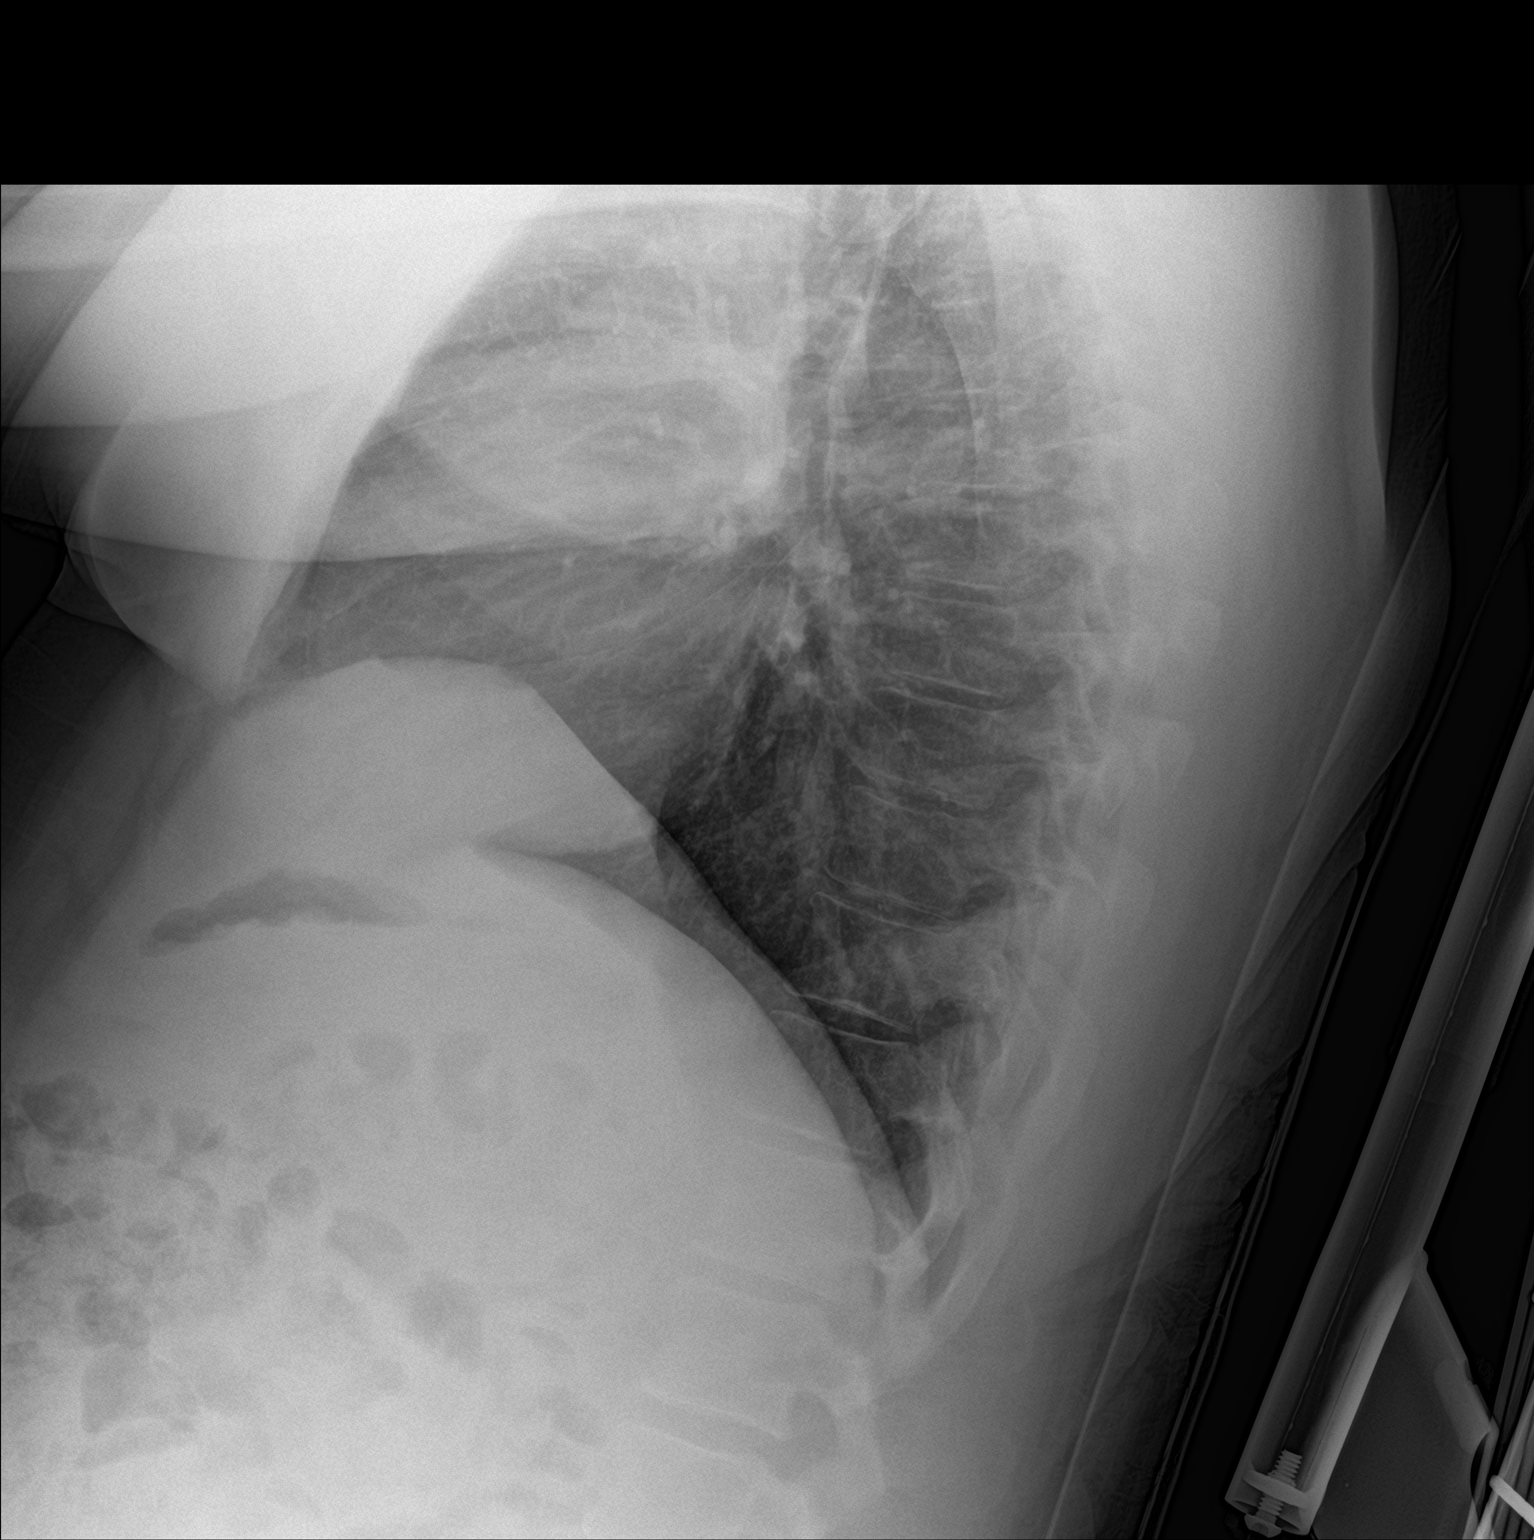

[chest ap]
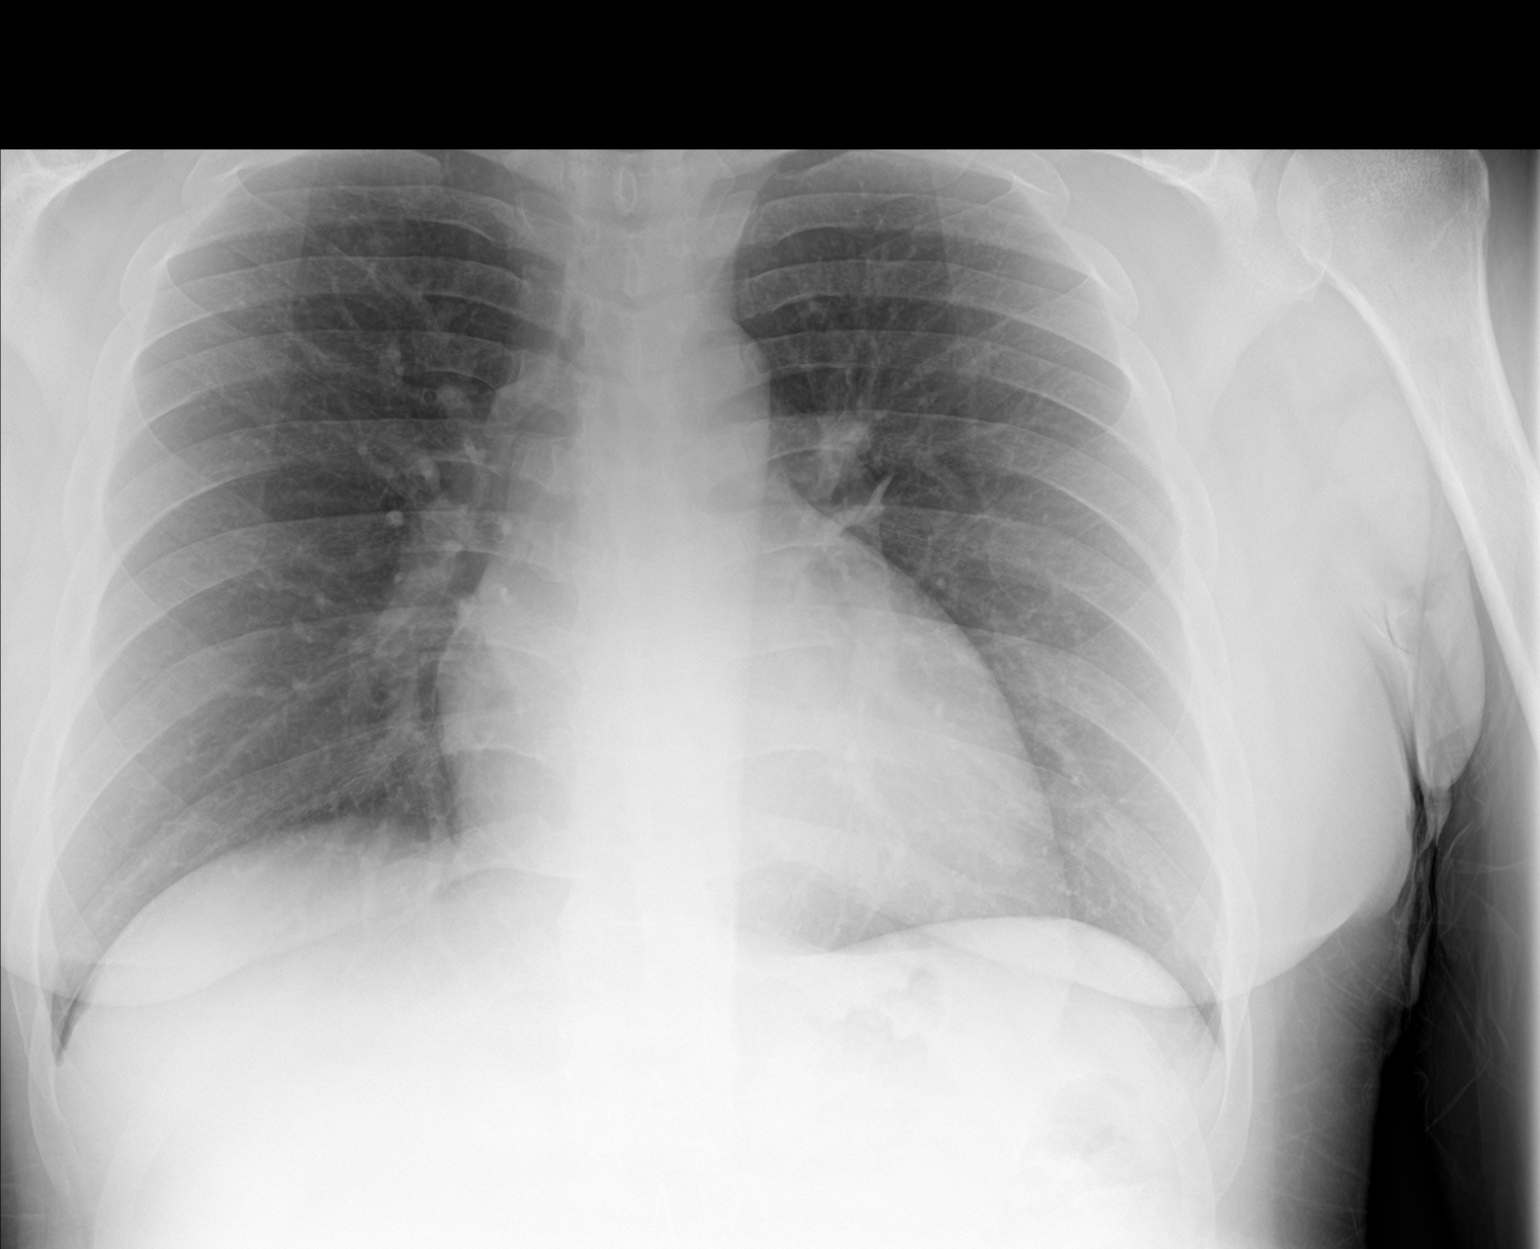

[2 of 2 positions shown; findings below may reference images not displayed]

FINDINGS: The heart size and mediastinal contours are within normal limits.
Both lungs are clear. The visualized skeletal structures are
unremarkable.
IMPRESSION: No active cardiopulmonary disease.

## 2019-05-22 ENCOUNTER — Ambulatory Visit: Payer: Medicaid Other | Attending: Internal Medicine

## 2020-05-26 ENCOUNTER — Encounter: Payer: Self-pay | Admitting: Emergency Medicine

## 2020-05-26 ENCOUNTER — Other Ambulatory Visit: Payer: Self-pay

## 2020-05-26 ENCOUNTER — Ambulatory Visit
Admission: EM | Admit: 2020-05-26 | Discharge: 2020-05-26 | Disposition: A | Discharge status: Home / Self Care | Payer: Medicaid Other | Attending: Emergency Medicine | Admitting: Emergency Medicine

## 2020-05-26 ENCOUNTER — Ambulatory Visit (HOSPITAL_COMMUNITY): Admission: EM | Admit: 2020-05-26 | Discharge: 2020-05-26 | Discharge status: Against Medical Advice | Payer: Self-pay

## 2020-05-26 DIAGNOSIS — S161XXA Strain of muscle, fascia and tendon at neck level, initial encounter: Secondary | ICD-10-CM

## 2020-05-26 MED ORDER — NAPROXEN 500 MG PO TABS: 500.0000 mg | ORAL_TABLET | Freq: Two times a day (BID) | ORAL | 0 refills | Status: DC

## 2020-05-26 MED ORDER — CYCLOBENZAPRINE HCL 5 MG PO TABS: 5.0000 mg | ORAL_TABLET | Freq: Every day | ORAL | 0 refills | Status: AC
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
## 2020-05-26 NOTE — Discharge Instructions (Signed)
Light stretching, heat, massage to your neck. See provided exercises.  Naproxen twice a day, take with food.  Muscle relaxer before bed as needed.  Follow up with your PCP and/or PM&R as needed for persistent symptoms.

## 2021-10-19 ENCOUNTER — Ambulatory Visit
Admission: EM | Admit: 2021-10-19 | Discharge: 2021-10-19 | Disposition: A | Discharge status: Home / Self Care | Payer: Medicaid Other | Attending: Family Medicine | Admitting: Family Medicine

## 2021-10-19 DIAGNOSIS — K1379 Other lesions of oral mucosa: Secondary | ICD-10-CM

## 2021-10-19 MED ORDER — CHLORHEXIDINE GLUCONATE 0.12 % MT SOLN: 15.0000 mL | Freq: Two times a day (BID) | OROMUCOSAL | 0 refills | Status: DC

## 2021-10-19 MED ORDER — AMOXICILLIN 875 MG PO TABS: 875.0000 mg | ORAL_TABLET | Freq: Two times a day (BID) | ORAL | 0 refills | Status: AC

## 2021-10-19 NOTE — ED Triage Notes (Signed)
Pt presents with oral swelling & oral pain in front bottom area since yesterday.

## 2021-10-19 NOTE — ED Provider Notes (Signed)
  [facility]   [id number] 10/19/21 Arrival Time: 0930  ASSESSMENT & PLAN:  1. Oral soft tissue complaint    Unclear etiology. No ulcerations present. Question early mucocele vs sialoadenitis vs early gingivitis. Will cover with amoxicillin. Home observation. Tolerating normal PO intake.  Discharge Medication List as of 10/19/2021 12:06 PM     START taking these medications   Details  amoxicillin (AMOXIL) 875 MG tablet Take 1 tablet (875 mg total) by mouth 2 (two) times daily for 7 days., Starting Wed 10/19/2021, Until Wed 10/26/2021, Normal    chlorhexidine (PERIDEX) 0.12 % solution Use as directed 15 mLs in the mouth or throat 2 (two) times daily., Starting Wed 10/19/2021, Normal         Follow-up Information     [facility] .   Specialty: Urgent Care Why: If worsening or failing to improve as anticipated. Contact information: [address] [phone] mc [place] [address] [phone]               Reviewed expectations re: course of current medical issues. Questions answered. Outlined signs and symptoms indicating need for more acute intervention. Understanding verbalized. After Visit Summary given.   SUBJECTIVE: History from: Patient. Evan Delgado is a 40 y.o. male. Reports: oral swelling; initially under tongue; this am noted spot on anterior lower gum; no pain. Afebrile. No bleeding from gums. Denies: jaw pain. Normal PO intake without n/v/d.  OBJECTIVE:  Vitals:   10/19/21 1044  BP: (!) 141/93  Pulse: 64  Resp: 18  Temp: 98.3 F (36.8 C)  TempSrc: Oral  SpO2: 98%    General appearance: alert; no distress Eyes: PERRLA; EOMI; conjunctiva normal HENT: [place]; AT; without nasal congestion; reddish area of swelling just behind front lower teeth under tongue; soft; non-tender; very small area of erythema on lower gum; normal jaw movement Neck: supple s LAD Lungs: speaks full sentences  without difficulty; unlabored Extremities: no edema Skin: warm and dry Neurologic: normal gait Psychological: alert and cooperative; normal mood and affect  No Known Allergies  Past Medical History:  Diagnosis Date   Diverticulitis    Diverticulitis    Hypertension    Social History   Socioeconomic History   Marital status: Single    Spouse name: Not on file   Number of children: Not on file   Years of education: Not on file   Highest education level: Not on file  Occupational History   Occupation: chief  Tobacco Use   Smoking status: Every Day    Packs/day: 0.50    Types: Cigarettes   Smokeless tobacco: Never  Substance and Sexual Activity   Alcohol use: No   Drug use: No   Sexual activity: Not on file  Other Topics Concern   Not on file  Social History Narrative   Not on file   Social Determinants of Health   Financial Resource Strain: Not on file  Food Insecurity: Not on file  Transportation Needs: Not on file  Physical Activity: Not on file  Stress: Not on file  Social Connections: Not on file  Intimate Partner Violence: Not on file   Family History  Problem Relation Age of Onset   Diabetes Father    Diabetes Brother    History reviewed. No pertinent surgical history.   Mardella Layman, MD 10/19/21 [phone]

## 2022-12-01 ENCOUNTER — Ambulatory Visit
Admission: EM | Admit: 2022-12-01 | Discharge: 2022-12-01 | Disposition: A | Discharge status: Home / Self Care | Payer: Medicaid Other | Attending: Internal Medicine | Admitting: Internal Medicine

## 2022-12-01 DIAGNOSIS — J069 Acute upper respiratory infection, unspecified: Secondary | ICD-10-CM | POA: Diagnosis not present

## 2022-12-01 DIAGNOSIS — R030 Elevated blood-pressure reading, without diagnosis of hypertension: Secondary | ICD-10-CM

## 2022-12-01 MED ORDER — BENZONATATE 100 MG PO CAPS: 100.0000 mg | ORAL_CAPSULE | Freq: Three times a day (TID) | ORAL | 0 refills | Status: AC | PRN

## 2022-12-01 MED ORDER — ALBUTEROL SULFATE HFA 108 (90 BASE) MCG/ACT IN AERS: 1.0000 | INHALATION_SPRAY | Freq: Four times a day (QID) | RESPIRATORY_TRACT | 0 refills | Status: AC | PRN

## 2022-12-01 NOTE — ED Triage Notes (Addendum)
Patient having nasal congestion, cough that is dry, chest congestion, felt feverish onset 2 days ago. Was sent home yesterday from work. Works at [employer]. No known sick exposure.   Tried dayquil with little relief.

## 2022-12-01 NOTE — ED Notes (Signed)
BP checked in both the right and left upper arm, 157/113 in the left arm and 167/123 in the right arm. Patient declines any headache, dizziness, or vision changes.   Provider notified.

## 2022-12-01 NOTE — Discharge Instructions (Signed)
Suspect that you have a cold virus that should run its course.  I have prescribed you 2 medications to help alleviate symptoms including an inhaler that you can take as needed for any shortness of breath.  Blood pressure is elevated today so encourage you to monitor your blood pressure closely over the next few days and follow-up with PCP or urgent care if it remains elevated.

## 2022-12-01 NOTE — ED Provider Notes (Signed)
[facility]    CSN: [id number] Arrival date & time: 12/01/22  1258      History   Chief Complaint Chief Complaint  Patient presents with   Nasal Congestion   Cough    HPI Evan Delgado is a 42 y.o. male.   Patient presenting with nasal congestion, cough, feelings of chest congestion that started 2 days ago.  Reports that he has had intermittent shortness of breath with exertion.  Denies history of asthma or COPD.  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  He has taken DayQuil for symptoms.  Blood pressure is also elevated today. he denies that he takes blood pressure medication but reports that he has been prescribed it by PCP approximately 1 year ago but does not take it.  Patient not reporting any headache, dizziness, blurred vision, nausea, vomiting, chest pain.   Cough   Past Medical History:  Diagnosis Date   Diverticulitis    Diverticulitis    Hypertension     There are no problems to display for this patient.   History reviewed. No pertinent surgical history.     Home Medications    Prior to Admission medications   Medication Sig Start Date End Date Taking? Authorizing Provider  albuterol (VENTOLIN HFA) 108 (90 Base) MCG/ACT inhaler Inhale 1-2 puffs into the lungs every 6 (six) hours as needed for wheezing or shortness of breath. 12/01/22  Yes Eriverto Byrnes, Rolly Salter E, FNP  benzonatate (TESSALON) 100 MG capsule Take 1 capsule (100 mg total) by mouth every 8 (eight) hours as needed for cough. 12/01/22  Yes Katianne Barre, Rolly Salter E, FNP  clonazePAM (KLONOPIN) 0.5 MG tablet Take 0.25-0.5 mg by mouth 2 (two) times daily as needed for anxiety.   Yes [provider]  chlorhexidine (PERIDEX) 0.12 % solution Use as directed 15 mLs in the mouth or throat 2 (two) times daily. 10/19/21   Mardella Layman, MD  cyclobenzaprine (FLEXERIL) 5 MG tablet Take 1 tablet (5 mg total) by mouth at bedtime. 05/26/20   Georgetta Haber, NP   metroNIDAZOLE (FLAGYL) 500 MG tablet Take 500 mg by mouth 3 (three) times daily.    [provider]  naproxen (NAPROSYN) 500 MG tablet Take 1 tablet (500 mg total) by mouth 2 (two) times daily. 05/26/20   Georgetta Haber, NP    Family History Family History  Problem Relation Age of Onset   Diabetes Father    Diabetes Brother     Social History Social History   Tobacco Use   Smoking status: Every Day    Current packs/day: 0.50    Types: Cigarettes   Smokeless tobacco: Never  Substance Use Topics   Alcohol use: No   Drug use: No     Allergies   Patient has no known allergies.   Review of Systems Review of Systems Per HPI  Physical Exam Triage Vital Signs ED Triage Vitals  Encounter Vitals Group     BP 12/01/22 1307 (!) 157/113     Systolic BP Percentile --      Diastolic BP Percentile --      Pulse Rate 12/01/22 1307 63     Resp 12/01/22 1307 18     Temp 12/01/22 1307 97.8 F (36.6 C)     Temp Source 12/01/22 1307 Oral     SpO2 12/01/22 1307 96 %     Weight 12/01/22 1307 220 lb (99.8 kg)  Height 12/01/22 1307 5\' 8"  (1.727 m)     Head Circumference --      Peak Flow --      Pain Score 12/01/22 1306 0     Pain Loc --      Pain Education --      Exclude from Growth Chart --    No data found.  Updated Vital Signs BP (!) 157/113 (BP Location: Left Arm) Comment: checked in both arms  Pulse 63   Temp 97.8 F (36.6 C) (Oral)   Resp 18   Ht 5\' 8"  (1.727 m)   Wt 220 lb (99.8 kg)   SpO2 96%   BMI 33.45 kg/m   Visual Acuity Right Eye Distance:   Left Eye Distance:   Bilateral Distance:    Right Eye Near:   Left Eye Near:    Bilateral Near:     Physical Exam Constitutional:      General: He is not in acute distress.    Appearance: Normal appearance. He is not toxic-appearing or diaphoretic.  HENT:     Head: Normocephalic and atraumatic.     Right Ear: Tympanic membrane and ear canal normal.     Left Ear: Tympanic membrane and ear  canal normal.     Nose: Congestion present.     Mouth/Throat:     Mouth: Mucous membranes are moist.     Pharynx: No posterior oropharyngeal erythema.  Eyes:     Extraocular Movements: Extraocular movements intact.     Conjunctiva/sclera: Conjunctivae normal.     Pupils: Pupils are equal, round, and reactive to light.  Cardiovascular:     Rate and Rhythm: Normal rate and regular rhythm.     Pulses: Normal pulses.     Heart sounds: Normal heart sounds.  Pulmonary:     Effort: Pulmonary effort is normal. No respiratory distress.     Breath sounds: Normal breath sounds. No stridor. No wheezing, rhonchi or rales.  Abdominal:     General: Abdomen is flat. Bowel sounds are normal.     Palpations: Abdomen is soft.  Musculoskeletal:        General: Normal range of motion.     Cervical back: Normal range of motion.  Skin:    General: Skin is warm and dry.  Neurological:     General: No focal deficit present.     Mental Status: He is alert and oriented to person, place, and time. Mental status is at baseline.  Psychiatric:        Mood and Affect: Mood normal.        Behavior: Behavior normal.      UC Treatments / Results  Labs (all labs ordered are listed, but only abnormal results are displayed) Labs Reviewed - No data to display  EKG   Radiology No results found.  Procedures Procedures (including critical care time)  Medications Ordered in UC Medications - No data to display  Initial Impression / Assessment and Plan / UC Course  I have reviewed the triage vital signs and the nursing notes.  Pertinent labs & imaging results that were available during my care of the patient were reviewed by me and considered in my medical decision making (see chart for details).     Patient presents with symptoms likely from a viral upper respiratory infection.  Do not suspect underlying cardiopulmonary process. Symptoms seem unlikely related to ACS, CHF or COPD exacerbations,  pneumonia, pneumothorax. Patient is nontoxic appearing and not in need of  emergent medical intervention.  Patient is reporting intermittent shortness of breath with exertion but there are no adventitious lung sounds on exam, no tachypnea, and oxygen is normal so do not think that chest imaging is necessary.  Discussed with patient that we do not have COVID testing here in urgent care today.  Recommended symptom control with medications and supportive care.  Patient was sent albuterol inhaler to take as needed for shortness of breath and benzonatate for coughing.  Offered patient nasal spray but he declined this.  Blood pressure slightly elevated today.  Encouraged patient to monitor blood pressure closely at home and follow-up with PCP as he reports that he is supposed to be taking medication for it.  Return if symptoms fail to improve. Patient states understanding and is agreeable.  Discharged with PCP followup.  Final Clinical Impressions(s) / UC Diagnoses   Final diagnoses:  Viral upper respiratory tract infection with cough  Elevated blood pressure reading     Discharge Instructions      Suspect that you have a cold virus that should run its course.  I have prescribed you 2 medications to help alleviate symptoms including an inhaler that you can take as needed for any shortness of breath.  Blood pressure is elevated today so encourage you to monitor your blood pressure closely over the next few days and follow-up with PCP or urgent care if it remains elevated.    ED Prescriptions     Medication Sig Dispense Auth. Provider   benzonatate (TESSALON) 100 MG capsule Take 1 capsule (100 mg total) by mouth every 8 (eight) hours as needed for cough. 21 capsule [place], Cashion E, [place]   albuterol (VENTOLIN HFA) 108 (90 Base) MCG/ACT inhaler Inhale 1-2 puffs into the lungs every 6 (six) hours as needed for wheezing or shortness of breath. 1 each Gustavus Bryant, [place]      PDMP not reviewed this  encounter.   Gustavus Bryant, [place] 12/01/22 1349

## 2023-01-18 DIAGNOSIS — E1165 Type 2 diabetes mellitus with hyperglycemia: Secondary | ICD-10-CM | POA: Insufficient documentation

## 2023-08-15 ENCOUNTER — Encounter: Payer: Self-pay | Admitting: Emergency Medicine

## 2023-08-15 ENCOUNTER — Ambulatory Visit: Admission: EM | Admit: 2023-08-15 | Discharge: 2023-08-15 | Disposition: A | Discharge status: Home / Self Care

## 2023-08-15 DIAGNOSIS — K05219 Aggressive periodontitis, localized, unspecified severity: Secondary | ICD-10-CM | POA: Diagnosis not present

## 2023-08-15 MED ORDER — AMOXICILLIN-POT CLAVULANATE 875-125 MG PO TABS: 1.0000 | ORAL_TABLET | Freq: Two times a day (BID) | ORAL | 0 refills | Status: AC

## 2023-08-15 MED ORDER — CHLORHEXIDINE GLUCONATE 0.12 % MT SOLN: 15.0000 mL | OROMUCOSAL | 0 refills | Status: AC

## 2023-08-15 MED ORDER — NAPROXEN 500 MG PO TABS: 500.0000 mg | ORAL_TABLET | Freq: Two times a day (BID) | ORAL | 0 refills | Status: AC

## 2023-08-15 NOTE — Discharge Instructions (Addendum)
 You were treated today for a periodontal abscess, which is an infection in the gums around a tooth. An incision and drainage procedure was performed to relieve pressure and remove pus. You were prescribed Augmentin to treat the infection--take it exactly as directed and complete the full course, even if you start to feel better. Use Peridex  mouth rinse twice daily to reduce bacteria in your mouth and help the area heal. Naproxen  has been ordered for pain. Do not use over-the-counter anti-inflammatories such as aspirin, Motrin, ibuprofen, or Aleve , as this may increase the risk of side effects. If needed, you may take Tylenol  (acetaminophen ) 1000 mg every six hours for additional pain relief. This equals two 500 mg tablets at a time. Be careful not to take more than 4000 mg of Tylenol  in a 24-hour period.In addition, rinse with warm saltwater three times a day to soothe the gums and reduce inflammation. Eat a soft diet while healing and maintain good oral hygiene, but avoid brushing directly over the abscessed area until it has healed. Do not poke or press on the area, and avoid using tobacco or alcohol-based mouthwashes, which can delay healing. Follow up with a dentist within 2 to 3 days if possible to ensure proper healing and receive definitive treatment. Go to the emergency department if you experience facial swelling, fever with chills, spreading redness, or trouble breathing, as these could be signs of a more serious infection.

## 2023-08-15 NOTE — ED Triage Notes (Signed)
 Pt reports dental pain to upper R corner x3 days. Tylenol  with little to no relief, not using ibuprofen yet.

## 2023-08-15 NOTE — ED Provider Notes (Signed)
 [facility]    CSN: [id number] Arrival date & time: 08/15/23  1733      History   Chief Complaint Chief Complaint  Patient presents with   Dental Pain    HPI Evan Delgado is a 43 y.o. male.   Evan Delgado is a 43 y.o. male that presents with gum pain and swelling to the right upper side of the mouth. The patient reports pain in both the teeth and the gum in the affected area, with associated gum swelling. The symptoms have been present for approximately 3-4 days. The patient experiences pain when chewing on the affected side. There is no reported history of previous issues with the tooth in question, and the patient does not believe the tooth is chipped. The patient denies any associated headache or pain when swallowing. The patient does not have a regular dentist or dental insurance, but reports having Medicaid coverage.   The following portions of the patient's history were reviewed and updated as appropriate: allergies, current medications, past family history, past medical history, past social history, past surgical history, and problem list     Past Medical History:  Diagnosis Date   Diverticulitis    Diverticulitis    Hypertension     Patient Active Problem List   Diagnosis Date Noted   Diabetes mellitus with hyperglycemia (HCC) 01/18/2023   Diverticulitis 12/25/2016   Anxiety 11/30/2016   Diverticulosis 11/30/2016   Prediabetes 11/30/2016   Chest pain 11/29/2016   Diverticulitis of colon with perforation 11/29/2016   Diverticulitis of intestine with abscess 11/29/2016   Tobacco use 11/29/2016   Essential hypertension 11/02/2016    History reviewed. No pertinent surgical history.     Home Medications    Prior to Admission medications   Medication Sig Start Date End Date Taking? Authorizing Provider  amLODipine-olmesartan (AZOR) 10-40 MG tablet qday 08/08/23  Yes [provider]  amoxicillin -clavulanate (AUGMENTIN) 875-125 MG  tablet Take 1 tablet by mouth every 12 (twelve) hours. 08/15/23  Yes Maryruth Sol, FNP  chlorhexidine  (PERIDEX ) 0.12 % solution Use as directed 15 mLs in the mouth or throat 2 (two) times daily at 8 am and 6 pm for 7 days. Brush teeth then swish in mouth for 2 minutes then spit out. Do not rinse mouth after use. Avoid eating, drinking, smoking and vaping for at least 30 minutes after use 08/15/23 08/22/23 Yes Tabrina Esty, FNP  clonazePAM (KLONOPIN) 0.5 MG tablet Take 0.25-0.5 mg by mouth 2 (two) times daily as needed for anxiety.   Yes [provider]  metFORMIN (GLUCOPHAGE-XR) 500 MG 24 hr tablet Take 1,000 mg by mouth daily with breakfast. 08/08/23 08/07/24 Yes [provider]  naproxen  (NAPROSYN ) 500 MG tablet Take 1 tablet (500 mg total) by mouth 2 (two) times daily with a meal. 08/15/23  Yes Maryruth Sol, FNP  pantoprazole (PROTONIX) 40 MG tablet Take 1 tablet by mouth daily. 03/06/22  Yes [provider]  albuterol  (VENTOLIN  HFA) 108 (90 Base) MCG/ACT inhaler Inhale 1-2 puffs into the lungs every 6 (six) hours as needed for wheezing or shortness of breath. Patient not taking: Reported on 08/15/2023 12/01/22   Dodson Freestone, FNP  alum & mag hydroxide-simeth (MAALOX/MYLANTA) 200-200-20 MG/5ML suspension Take 30 mLs by mouth. Patient not taking: Reported on 08/15/2023 07/20/17   [provider]  benzonatate  (TESSALON ) 100 MG capsule Take 1 capsule (100 mg total) by mouth every 8 (eight) hours as needed for cough. Patient not taking: Reported on 08/15/2023 12/01/22  [employer], [place], [place]  Blood Glucose Monitoring Suppl (FIFTY50 GLUCOSE METER 2.0) w/Device KIT Use as instructed include test strips and lancets 100 each 12/21/16   [provider]  Blood Glucose Monitoring Suppl (FREESTYLE LITE) DEVI Meter #1; strips 100. Lancets 100. E11.9. check sugar TID. Record in log book for physician Patient not taking: Reported on 08/15/2023 06/03/21   [provider]  cyclobenzaprine  (FLEXERIL ) 5 MG tablet Take 1 tablet (5 mg total) by mouth at bedtime. Patient not taking: Reported on 08/15/2023 05/26/20   Burky, Natalie B, NP  HYDROcodone -acetaminophen  (NORCO/VICODIN) 5-325 MG tablet Take 1 tablet by mouth. Patient not taking: Reported on 08/15/2023 05/11/17   [provider]  metFORMIN (GLUCOPHAGE-XR) 750 MG 24 hr tablet Take by mouth. Patient not taking: Reported on 08/15/2023 06/03/21   [provider]  omeprazole (PRILOSEC) 20 MG capsule Take 20 mg by mouth. Patient not taking: Reported on 08/15/2023 02/22/17   [provider]  Semaglutide,0.25 or 0.5MG /DOS, 2 MG/3ML SOPN Inject 0.25 mg into the skin. Patient not taking: Reported on 08/15/2023 08/09/23   [provider]  sucralfate (CARAFATE) 1 GM/10ML suspension Take 1 g by mouth. Patient not taking: Reported on 08/15/2023 05/11/17   [provider]    Family History Family History  Problem Relation Age of Onset   Diabetes Father    Diabetes Brother     Social History Social History   Tobacco Use   Smoking status: Every Day    Current packs/day: 0.50    Types: Cigarettes   Smokeless tobacco: Never  Vaping Use   Vaping status: Never Used  Substance Use Topics   Alcohol use: No   Drug use: No     Allergies   Patient has no known allergies.   Review of Systems Review of Systems  HENT:  Positive for dental problem. Negative for trouble swallowing.   Neurological:  Negative for headaches.  All other systems reviewed and are negative.    Physical Exam Triage Vital Signs ED Triage Vitals [08/15/23 1824]  Encounter Vitals Group     BP (!) 127/91     Girls Systolic BP Percentile      Girls Diastolic BP Percentile      Boys Systolic BP Percentile      Boys Diastolic BP Percentile      Pulse Rate 88     Resp 18     Temp 98.3 F (36.8 C)     Temp Source Oral     SpO2 96 %     Weight      Height      Head Circumference       Peak Flow      Pain Score 10     Pain Loc      Pain Education      Exclude from Growth Chart    No data found.  Updated Vital Signs BP (!) 127/91 (BP Location: Left Arm)   Pulse 88   Temp 98.3 F (36.8 C) (Oral)   Resp 18   SpO2 96%   Visual Acuity Right Eye Distance:   Left Eye Distance:   Bilateral Distance:    Right Eye Near:   Left Eye Near:    Bilateral Near:     Physical Exam Vitals reviewed.  Constitutional:      General: He is not in acute distress.    Appearance: Normal appearance. He is not toxic-appearing.  HENT:     Head: Normocephalic.  Mouth/Throat:     Mouth: Mucous membranes are moist.     Dentition: Dental abscesses present.     Pharynx: Oropharynx is clear. Uvula midline.     Comments: Swelling noted to the right upper gum with underlying fluctuance. Tender to touch. No obvious dental caries or other gum lesions noted.   Eyes:     Conjunctiva/sclera: Conjunctivae normal.    Cardiovascular:     Rate and Rhythm: Normal rate and regular rhythm.     Heart sounds: Normal heart sounds.  Pulmonary:     Effort: Pulmonary effort is normal.     Breath sounds: Normal breath sounds.   Musculoskeletal:        General: Normal range of motion.   Skin:    General: Skin is warm and dry.   Neurological:     General: No focal deficit present.     Mental Status: He is alert and oriented to person, place, and time.      UC Treatments / Results  Labs (all labs ordered are listed, but only abnormal results are displayed) Labs Reviewed - No data to display  EKG   Radiology No results found.  Procedures Incision and Drainage  Date/Time: 08/15/2023 7:05 PM  Performed by: Maryruth Sol, FNP Authorized by: Maryruth Sol, FNP   Consent:    Consent obtained:  Verbal   Consent given by:  Patient   Risks, benefits, and alternatives were discussed: yes     Risks discussed:  Bleeding, incomplete drainage, pain and infection    Alternatives discussed:  No treatment Universal protocol:    Patient identity confirmed:  Verbally with patient and arm band Location:    Type:  Abscess (peridontal)   Location:  Mouth   Mouth location: right upper gum. Anesthesia:    Anesthesia method:  Topical application and local infiltration   Topical anesthetic:  Lidocaine gel   Local anesthetic:  Lidocaine 1% w/o epi Procedure type:    Complexity:  Simple Procedure details:    Drainage:  Bloody   Drainage amount:  Scant   Wound treatment:  Wound left open Post-procedure details:    Procedure completion:  Tolerated well, no immediate complications  (including critical care time)  Medications Ordered in UC Medications - No data to display  Initial Impression / Assessment and Plan / UC Course  I have reviewed the triage vital signs and the nursing notes.  Pertinent labs & imaging results that were available during my care of the patient were reviewed by me and considered in my medical decision making (see chart for details).     The patient presents with right-sided gum pain with associated swelling for the past 3 to 4 days. Examination revealed a small abscess in the gum area, while the tooth itself appears structurally intact with no visible fractures or chipping. The patient reports pain with chewing but denies headache, fever, or difficulty swallowing. The abscess was incised and drained in clinic to relieve pressure and reduce infection risk. A topical anesthetic was applied for immediate pain relief. The patient does not have dental insurance but does have Medicaid, and was provided with resources for low-income and Medicaid-accepting dental providers. Education was given on the need for timely dental follow-up for definitive care, such as possible root canal or extraction. The patient should follow up with a dentist as soon as possible and seek emergency care if facial swelling, fever, spreading redness, or difficulty  breathing or swallowing occurs.  Today's evaluation has revealed  no signs of a dangerous process. Discussed diagnosis with patient and/or guardian. Patient and/or guardian aware of their diagnosis, possible red flag symptoms to watch out for and need for close follow up. Patient and/or guardian understands verbal and written discharge instructions. Patient and/or guardian comfortable with plan and disposition.  Patient and/or guardian has a clear mental status at this time, good insight into illness (after discussion and teaching) and has clear judgment to make decisions regarding their care  Documentation was completed with the aid of voice recognition software. Transcription may contain typographical errors.  Final Clinical Impressions(s) / UC Diagnoses   Final diagnoses:  Acute periodontal abscess     Discharge Instructions      You were treated today for a periodontal abscess, which is an infection in the gums around a tooth. An incision and drainage procedure was performed to relieve pressure and remove pus. You were prescribed Augmentin to treat the infection--take it exactly as directed and complete the full course, even if you start to feel better. Use Peridex  mouth rinse twice daily to reduce bacteria in your mouth and help the area heal. Naproxen  has been ordered for pain. Do not use over-the-counter anti-inflammatories such as aspirin, Motrin, ibuprofen, or Aleve , as this may increase the risk of side effects. If needed, you may take Tylenol  (acetaminophen ) 1000 mg every six hours for additional pain relief. This equals two 500 mg tablets at a time. Be careful not to take more than 4000 mg of Tylenol  in a 24-hour period.In addition, rinse with warm saltwater three times a day to soothe the gums and reduce inflammation. Eat a soft diet while healing and maintain good oral hygiene, but avoid brushing directly over the abscessed area until it has healed. Do not poke or press on the area, and  avoid using tobacco or alcohol-based mouthwashes, which can delay healing. Follow up with a dentist within 2 to 3 days if possible to ensure proper healing and receive definitive treatment. Go to the emergency department if you experience facial swelling, fever with chills, spreading redness, or trouble breathing, as these could be signs of a more serious infection.      ED Prescriptions     Medication Sig Dispense Auth. Provider   amoxicillin -clavulanate (AUGMENTIN) 875-125 MG tablet Take 1 tablet by mouth every 12 (twelve) hours. 14 tablet Michaelann Gunnoe, [place], FNP   chlorhexidine  (PERIDEX ) 0.12 % solution Use as directed 15 mLs in the mouth or throat 2 (two) times daily at 8 am and 6 pm for 7 days. Brush teeth then swish in mouth for 2 minutes then spit out. Do not rinse mouth after use. Avoid eating, drinking, smoking and vaping for at least 30 minutes after use 210 mL Sumayah Bearse, Ova Bloomer, FNP   naproxen  (NAPROSYN ) 500 MG tablet Take 1 tablet (500 mg total) by mouth 2 (two) times daily with a meal. 20 tablet Maryruth Sol, FNP      PDMP not reviewed this encounter.   Maryruth Sol, [place] 08/15/23 1940
